# Patient Record
Sex: Female | Born: 1995 | Race: White | Hispanic: No | Marital: Single | Smoking: Former smoker
Health system: Southern US, Community
[De-identification: ages and names within clinical notes are randomized; demographics above are authoritative.]

## PROBLEM LIST (undated history)

## (undated) ENCOUNTER — Inpatient Hospital Stay (HOSPITAL_COMMUNITY): Payer: Self-pay

## (undated) DIAGNOSIS — Z789 Other specified health status: Secondary | ICD-10-CM

## (undated) DIAGNOSIS — R110 Nausea: Secondary | ICD-10-CM

## (undated) DIAGNOSIS — R58 Hemorrhage, not elsewhere classified: Secondary | ICD-10-CM

## (undated) DIAGNOSIS — Z3491 Encounter for supervision of normal pregnancy, unspecified, first trimester: Principal | ICD-10-CM

## (undated) HISTORY — DX: Encounter for supervision of normal pregnancy, unspecified, first trimester: Z34.91

## (undated) HISTORY — DX: Nausea: R11.0

## (undated) HISTORY — PX: NO PAST SURGERIES: SHX2092

## (undated) HISTORY — DX: Hemorrhage, not elsewhere classified: R58

---

## 2013-08-19 NOTE — L&D Delivery Note (Signed)
Delivery Note At 3:05 PM a viable female was delivered via Vaginal, Spontaneous Delivery (Presentation: Left Occiput Anterior).  APGAR: 9, 9; weight pending .   Placenta status: Intact, Spontaneous.  Cord: 3 vessels with the following complications: None.  Cord pH: n/a  Anesthesia: Epidural  Episiotomy: None Lacerations: Bilateral Labial Suture Repair: none, laceration was hemostatic and patient did not desire suture repair Est. Blood Loss (mL): 200  Mom to postpartum.  Baby to Couplet care / Skin to Skin.  Adriana Perry, Caleb 06/22/2014, 3:59 PM   I was gloved and present for delivery in its entirety.  Second stage of labor progressed, baby delivered after 20-30 minutes of pushing.  no decels during second stage noted.  Complications: none  Lacerations: bilateral labial, discussed that they were both well approximated and hemostatic, as such patient declined repair  EBL: 200  Adriana Perry ROCIO, MD 8:09 PM

## 2013-10-25 ENCOUNTER — Encounter: Payer: Self-pay | Admitting: Adult Health

## 2013-10-25 ENCOUNTER — Encounter (INDEPENDENT_AMBULATORY_CARE_PROVIDER_SITE_OTHER): Payer: Self-pay

## 2013-10-25 ENCOUNTER — Ambulatory Visit (INDEPENDENT_AMBULATORY_CARE_PROVIDER_SITE_OTHER): Payer: Medicaid Other | Admitting: Adult Health

## 2013-10-25 VITALS — BP 128/78 | Ht 64.0 in | Wt 161.0 lb

## 2013-10-25 DIAGNOSIS — Z3201 Encounter for pregnancy test, result positive: Secondary | ICD-10-CM

## 2013-10-25 DIAGNOSIS — Z3200 Encounter for pregnancy test, result unknown: Secondary | ICD-10-CM

## 2013-10-25 LAB — POCT URINE PREGNANCY: Preg Test, Ur: POSITIVE

## 2013-10-26 ENCOUNTER — Other Ambulatory Visit: Payer: Self-pay | Admitting: Obstetrics and Gynecology

## 2013-10-26 DIAGNOSIS — O3680X0 Pregnancy with inconclusive fetal viability, not applicable or unspecified: Secondary | ICD-10-CM

## 2013-10-29 ENCOUNTER — Other Ambulatory Visit: Payer: Self-pay | Admitting: Obstetrics and Gynecology

## 2013-10-29 ENCOUNTER — Ambulatory Visit (INDEPENDENT_AMBULATORY_CARE_PROVIDER_SITE_OTHER): Payer: Medicaid Other

## 2013-10-29 DIAGNOSIS — O9989 Other specified diseases and conditions complicating pregnancy, childbirth and the puerperium: Secondary | ICD-10-CM

## 2013-10-29 DIAGNOSIS — O26849 Uterine size-date discrepancy, unspecified trimester: Secondary | ICD-10-CM

## 2013-10-29 DIAGNOSIS — N926 Irregular menstruation, unspecified: Secondary | ICD-10-CM

## 2013-10-29 DIAGNOSIS — O99891 Other specified diseases and conditions complicating pregnancy: Secondary | ICD-10-CM

## 2013-10-29 DIAGNOSIS — O3680X0 Pregnancy with inconclusive fetal viability, not applicable or unspecified: Secondary | ICD-10-CM

## 2013-10-29 NOTE — Progress Notes (Signed)
U/S-transvaginal u/s performed, single IUP with +YS noted no fetal pole noted on today's exam, GS meas c/w 5-6 wks, cx appears closed, bilateral adnexa appears wnl with C.L. Noted on Rt, no free fluid or adnexal masses noted within pelvis

## 2013-11-05 ENCOUNTER — Other Ambulatory Visit: Payer: Self-pay | Admitting: Obstetrics & Gynecology

## 2013-11-05 DIAGNOSIS — O3680X0 Pregnancy with inconclusive fetal viability, not applicable or unspecified: Secondary | ICD-10-CM

## 2013-11-10 ENCOUNTER — Ambulatory Visit (INDEPENDENT_AMBULATORY_CARE_PROVIDER_SITE_OTHER): Payer: Medicaid Other

## 2013-11-10 ENCOUNTER — Other Ambulatory Visit: Payer: Self-pay | Admitting: Obstetrics & Gynecology

## 2013-11-10 ENCOUNTER — Encounter: Payer: Self-pay | Admitting: Women's Health

## 2013-11-10 ENCOUNTER — Ambulatory Visit (INDEPENDENT_AMBULATORY_CARE_PROVIDER_SITE_OTHER): Payer: Medicaid Other | Admitting: Women's Health

## 2013-11-10 VITALS — BP 104/62 | Wt 159.0 lb

## 2013-11-10 DIAGNOSIS — O210 Mild hyperemesis gravidarum: Secondary | ICD-10-CM

## 2013-11-10 DIAGNOSIS — Z3400 Encounter for supervision of normal first pregnancy, unspecified trimester: Secondary | ICD-10-CM | POA: Insufficient documentation

## 2013-11-10 DIAGNOSIS — O3680X0 Pregnancy with inconclusive fetal viability, not applicable or unspecified: Secondary | ICD-10-CM

## 2013-11-10 DIAGNOSIS — O26849 Uterine size-date discrepancy, unspecified trimester: Secondary | ICD-10-CM

## 2013-11-10 DIAGNOSIS — Z331 Pregnant state, incidental: Secondary | ICD-10-CM

## 2013-11-10 DIAGNOSIS — Z1389 Encounter for screening for other disorder: Secondary | ICD-10-CM

## 2013-11-10 LAB — CBC
HEMATOCRIT: 36.5 % (ref 36.0–46.0)
Hemoglobin: 12.8 g/dL (ref 12.0–15.0)
MCH: 31.8 pg (ref 26.0–34.0)
MCHC: 35.1 g/dL (ref 30.0–36.0)
MCV: 90.6 fL (ref 78.0–100.0)
Platelets: 408 10*3/uL — ABNORMAL HIGH (ref 150–400)
RBC: 4.03 MIL/uL (ref 3.87–5.11)
RDW: 13 % (ref 11.5–15.5)
WBC: 15.2 10*3/uL — ABNORMAL HIGH (ref 4.0–10.5)

## 2013-11-10 LAB — POCT URINALYSIS DIPSTICK
Glucose, UA: NEGATIVE
Ketones, UA: NEGATIVE
Nitrite, UA: NEGATIVE

## 2013-11-10 MED ORDER — DOXYLAMINE-PYRIDOXINE 10-10 MG PO TBEC: 10.0000 mg | DELAYED_RELEASE_TABLET | ORAL | Status: DC

## 2013-11-10 NOTE — Progress Notes (Signed)
U/S-single IUP with +FCA noted, FHR-141 BPM, cx long and closed, bilateral adnexa wnl, CRL c/w 6+5wks EDD 07/01/2014

## 2013-11-10 NOTE — Progress Notes (Signed)
  Subjective:  Adriana Perry is a 18 y.o. G1P0 African American female at 26280w5d by today's u/s, being seen today for her first obstetrical visit.  Her obstetrical history is significant for primigravida. previous smoker 6-7cig/wk, stopped w/ +PT. Marland Kitchen.  Pregnancy history fully reviewed.  Patient reports nausea, vomiting and requests meds. Denies vb, cramping, uti s/s, abnormal/malodorous vag d/c, or vulvovaginal itching/irritation.  BP 104/62  Wt 159 lb (72.122 kg)  LMP 09/01/2013  HISTORY: OB History  Gravida Para Term Preterm AB SAB TAB Ectopic Multiple Living  1             # Outcome Date GA Lbr Len/2nd Weight Sex Delivery Anes PTL Lv  1 CUR              History reviewed. No pertinent past medical history. History reviewed. No pertinent past surgical history. Family History  Problem Relation Age of Onset  . Hypertension Paternal Grandfather   . Diabetes Paternal Grandfather   . Hypertension Paternal Grandmother   . Diabetes Paternal Grandmother   . Thyroid disease Paternal Grandmother   . Diabetes Maternal Grandfather   . Hypertension Maternal Grandfather     Exam   System:     General: Well developed & nourished, no acute distress   Skin: Warm & dry, normal coloration and turgor, no rashes   Neurologic: Alert & oriented, normal mood   Cardiovascular: Regular rate & rhythm   Respiratory: Effort & rate normal, LCTAB, acyanotic   Abdomen: Soft, non tender   Extremities: normal strength, tone   Pelvic Exam:    Perineum: Normal perineum   Vulva: Normal, no lesions   Vagina:  Normal mucosa, normal discharge   Cervix: Normal, bulbous, appears closed   Uterus: Normal size/shape/contour for GA   Thin prep pap smear n/a <21yo  FHR: 141 via u/s   Assessment:   Pregnancy: G1P0 Patient Active Problem List   Diagnosis Date Noted  . Supervision of normal first pregnancy 11/10/2013    Priority: High    86280w5d G1P0 New OB visit N/V of pregnancy    Plan:  Initial labs  drawn Continue prenatal vitamins Problem list reviewed and updated Reviewed n/v relief measures and warning s/s to report Rx diclegis, prior auth sent, and 2 samples given.  Reviewed recommended weight gain based on pre-gravid BMI Encouraged well-balanced diet Genetic Screening discussed Integrated Screen: requested Cystic fibrosis screening discussed requested Ultrasound discussed; fetal survey: requested Follow up in 5 weeks for 1st it/nt and visit [facility] completed NFPartnership referral done  Marge DuncansBooker, Trell Secrist Randall CNM, [facility] AssociatesWHNP-BC 11/10/2013 3:35 PM

## 2013-11-10 NOTE — Patient Instructions (Signed)
Nausea & Vomiting  Have saltine crackers or pretzels by your bed and eat a few bites before you raise your head out of bed in the morning  Eat small frequent meals throughout the day instead of large meals  Drink plenty of fluids throughout the day to stay hydrated, just don't drink a lot of fluids with your meals.  This can make your stomach fill up faster making you feel sick  Do not brush your teeth right after you eat  Products with real ginger are good for nausea, like ginger ale and ginger hard candy Make sure it says made with real ginger!  Sucking on sour candy like lemon heads is also good for nausea  If your prenatal vitamins make you nauseated, take them at night so you will sleep through the nausea  If you feel like you need medicine for the nausea & vomiting please let us know  If you are unable to keep any fluids or food down please let us know    Pregnancy - First Trimester During sexual intercourse, millions of sperm go into the vagina. Only 1 sperm will penetrate and fertilize the female egg while it is in the Fallopian tube. One week later, the fertilized egg implants into the wall of the uterus. An embryo begins to develop into a baby. At 6 to 8 weeks, the eyes and face are formed and the heartbeat can be seen on ultrasound. At the end of 12 weeks (first trimester), all the baby's organs are formed. Now that you are pregnant, you will want to do everything you can to have a healthy baby. Two of the most important things are to get good prenatal care and follow your caregiver's instructions. Prenatal care is all the medical care you receive before the baby's birth. It is given to prevent, find, and treat problems during the pregnancy and childbirth. PRENATAL EXAMS  During prenatal visits, your weight, blood pressure, and urine are checked. This is done to make sure you are healthy and progressing normally during the pregnancy.  A pregnant woman should gain 25 to 35 pounds  during the pregnancy. However, if you are overweight or underweight, your caregiver will advise you regarding your weight.  Your caregiver will ask and answer questions for you.  Blood work, cervical cultures, other necessary tests, and a Pap test are done during your prenatal exams. These tests are done to check on your health and the probable health of your baby. Tests are strongly recommended and done for HIV with your permission. This is the virus that causes AIDS. These tests are done because medicines can be given to help prevent your baby from being born with this infection should you have been infected without knowing it. Blood work is also used to find out your blood type, previous infections, and follow your blood levels (hemoglobin).  Low hemoglobin (anemia) is common during pregnancy. Iron and vitamins are given to help prevent this. Later in the pregnancy, blood tests for diabetes will be done along with any other tests if any problems develop.  You may need other tests to make sure you and the baby are doing well. CHANGES DURING THE FIRST TRIMESTER  Your body goes through many changes during pregnancy. They vary from person to person. Talk to your caregiver about changes you notice and are concerned about. Changes can include:  Your menstrual period stops.  The egg and sperm carry the genes that determine what you look like. Genes from you   and your partner are forming a baby. The female genes determine whether the baby is a boy or a girl.  Your body increases in girth and you may feel bloated.  Feeling sick to your stomach (nauseous) and throwing up (vomiting). If the vomiting is uncontrollable, call your caregiver.  Your breasts will begin to enlarge and become tender.  Your nipples may stick out more and become darker.  The need to urinate more. Painful urination may mean you have a bladder infection.  Tiring easily.  Loss of appetite.  Cravings for certain kinds of  food.  At first, you may gain or lose a couple of pounds.  You may have changes in your emotions from day to day (excited to be pregnant or concerned something may go wrong with the pregnancy and baby).  You may have more vivid and strange dreams. HOME CARE INSTRUCTIONS   It is very important to avoid all smoking, alcohol and non-prescribed drugs during your pregnancy. These affect the formation and growth of the baby. Avoid chemicals while pregnant to ensure the delivery of a healthy infant.  Start your prenatal visits by the 12th week of pregnancy. They are usually scheduled monthly at first, then more often in the last 2 months before delivery. Keep your caregiver's appointments. Follow your caregiver's instructions regarding medicine use, blood and lab tests, exercise, and diet.  During pregnancy, you are providing food for you and your baby. Eat regular, well-balanced meals. Choose foods such as meat, fish, milk and other low fat dairy products, vegetables, fruits, and whole-grain breads and cereals. Your caregiver will tell you of the ideal weight gain.  You can help morning sickness by keeping soda crackers at the bedside. Eat a couple before arising in the morning. You may want to use the crackers without salt on them.  Eating 4 to 5 small meals rather than 3 large meals a day also may help the nausea and vomiting.  Drinking liquids between meals instead of during meals also seems to help nausea and vomiting.  A physical sexual relationship may be continued throughout pregnancy if there are no other problems. Problems may be early (premature) leaking of amniotic fluid from the membranes, vaginal bleeding, or belly (abdominal) pain.  Exercise regularly if there are no restrictions. Check with your caregiver or physical therapist if you are unsure of the safety of some of your exercises. Greater weight gain will occur in the last 2 trimesters of pregnancy. Exercising will  help:  Control your weight.  Keep you in shape.  Prepare you for labor and delivery.  Help you lose your pregnancy weight after you deliver your baby.  Wear a good support or jogging bra for breast tenderness during pregnancy. This may help if worn during sleep too.  Ask when prenatal classes are available. Begin classes when they are offered.  Do not use hot tubs, steam rooms, or saunas.  Wear your seat belt when driving. This protects you and your baby if you are in an accident.  Avoid raw meat, uncooked cheese, cat litter boxes, and soil used by cats throughout the pregnancy. These carry germs that can cause birth defects in the baby.  The first trimester is a good time to visit your dentist for your dental health. Getting your teeth cleaned is okay. Use a softer toothbrush and brush gently during pregnancy.  Ask for help if you have financial, counseling, or nutritional needs during pregnancy. Your caregiver will be able to offer counseling for   these needs as well as refer you for other special needs.  Do not take any medicines or herbs unless told by your caregiver.  Inform your caregiver if there is any mental or physical domestic violence.  Make a list of emergency phone numbers of family, friends, hospital, and police and fire departments.  Write down your questions. Take them to your prenatal visit.  Do not douche.  Do not cross your legs.  If you have to stand for long periods of time, rotate you feet or take small steps in a circle.  You may have more vaginal secretions that may require a sanitary pad. Do not use tampons or scented sanitary pads. MEDICINES AND DRUG USE IN PREGNANCY  Take prenatal vitamins as directed. The vitamin should contain 1 milligram of folic acid. Keep all vitamins out of reach of children. Only a couple vitamins or tablets containing iron may be fatal to a baby or young child when ingested.  Avoid use of all medicines, including herbs,  over-the-counter medicines, not prescribed or suggested by your caregiver. Only take over-the-counter or prescription medicines for pain, discomfort, or fever as directed by your caregiver. Do not use aspirin, ibuprofen, or naproxen unless directed by your caregiver.  Let your caregiver also know about herbs you may be using.  Alcohol is related to a number of birth defects. This includes fetal alcohol syndrome. All alcohol, in any form, should be avoided completely. Smoking will cause low birth rate and premature babies.  Street or illegal drugs are very harmful to the baby. They are absolutely forbidden. A baby born to an addicted mother will be addicted at birth. The baby will go through the same withdrawal an adult does.  Let your caregiver know about any medicines that you have to take and for what reason you take them. SEEK MEDICAL CARE IF:  You have any concerns or worries during your pregnancy. It is better to call with your questions if you feel they cannot wait, rather than worry about them. SEEK IMMEDIATE MEDICAL CARE IF:   An unexplained oral temperature above 102 F (38.9 C) develops, or as your caregiver suggests.  You have leaking of fluid from the vagina (birth canal). If leaking membranes are suspected, take your temperature and inform your caregiver of this when you call.  There is vaginal spotting or bleeding. Notify your caregiver of the amount and how many pads are used.  You develop a bad smelling vaginal discharge with a change in the color.  You continue to feel sick to your stomach (nauseated) and have no relief from remedies suggested. You vomit blood or coffee ground-like materials.  You lose more than 2 pounds of weight in 1 week.  You gain more than 2 pounds of weight in 1 week and you notice swelling of your face, hands, feet, or legs.  You gain 5 pounds or more in 1 week (even if you do not have swelling of your hands, face, legs, or feet).  You get  exposed to German measles and have never had them.  You are exposed to fifth disease or chickenpox.  You develop belly (abdominal) pain. Round ligament discomfort is a common non-cancerous (benign) cause of abdominal pain in pregnancy. Your caregiver still must evaluate this.  You develop headache, fever, diarrhea, pain with urination, or shortness of breath.  You fall or are in a car accident or have any kind of trauma.  There is mental or physical violence in your home. Document   Released: 07/30/2001 Document Revised: 04/29/2012 Document Reviewed: 01/31/2009 [employer] Patient Information 2014 [employer].  

## 2013-11-11 LAB — DRUG SCREEN, URINE, NO CONFIRMATION
AMPHETAMINE SCRN UR: NEGATIVE
BENZODIAZEPINES.: NEGATIVE
Barbiturate Quant, Ur: NEGATIVE
Cocaine Metabolites: NEGATIVE
Creatinine,U: 376.6 mg/dL
Marijuana Metabolite: POSITIVE — AB
Methadone: NEGATIVE
Opiate Screen, Urine: NEGATIVE
PHENCYCLIDINE (PCP): NEGATIVE
Propoxyphene: NEGATIVE

## 2013-11-11 LAB — OXYCODONE SCREEN, UA, RFLX CONFIRM: Oxycodone Screen, Ur: NEGATIVE ng/mL

## 2013-11-11 LAB — URINE CULTURE
Colony Count: NO GROWTH
Organism ID, Bacteria: NO GROWTH

## 2013-11-11 LAB — URINALYSIS
Bilirubin Urine: NEGATIVE
Glucose, UA: NEGATIVE mg/dL
Hgb urine dipstick: NEGATIVE
Nitrite: NEGATIVE
Protein, ur: 30 mg/dL — AB
Specific Gravity, Urine: >1.03 — ABNORMAL HIGH (ref 1.005–1.030)
Urobilinogen, UA: 0.2 mg/dL (ref 0.0–1.0)
pH: 6 (ref 5.0–8.0)

## 2013-11-11 LAB — RPR

## 2013-11-11 LAB — GC/CHLAMYDIA PROBE AMP
CT Probe RNA: NEGATIVE
GC Probe RNA: NEGATIVE

## 2013-11-11 LAB — ABO AND RH: Rh Type: POSITIVE

## 2013-11-11 LAB — VARICELLA ZOSTER ANTIBODY, IGG: Varicella IgG: 1145 Index — ABNORMAL HIGH (ref <135.00)

## 2013-11-11 LAB — HIV ANTIBODY (ROUTINE TESTING W REFLEX): HIV: NONREACTIVE

## 2013-11-11 LAB — SICKLE CELL SCREEN: Sickle Cell Screen: NEGATIVE

## 2013-11-11 LAB — RUBELLA SCREEN: Rubella: 6.32 Index — ABNORMAL HIGH (ref <0.90)

## 2013-11-11 LAB — HEPATITIS B SURFACE ANTIGEN: Hepatitis B Surface Ag: NEGATIVE

## 2013-11-11 LAB — ANTIBODY SCREEN: ANTIBODY SCREEN: NEGATIVE

## 2013-11-12 LAB — CYSTIC FIBROSIS DIAGNOSTIC STUDY

## 2013-11-15 ENCOUNTER — Encounter: Payer: Self-pay | Admitting: Women's Health

## 2013-11-15 DIAGNOSIS — F1290 Cannabis use, unspecified, uncomplicated: Secondary | ICD-10-CM | POA: Insufficient documentation

## 2013-12-15 ENCOUNTER — Other Ambulatory Visit: Payer: Self-pay | Admitting: Women's Health

## 2013-12-15 ENCOUNTER — Ambulatory Visit (INDEPENDENT_AMBULATORY_CARE_PROVIDER_SITE_OTHER): Payer: BC Managed Care – PPO

## 2013-12-15 ENCOUNTER — Ambulatory Visit (INDEPENDENT_AMBULATORY_CARE_PROVIDER_SITE_OTHER): Payer: BC Managed Care – PPO | Admitting: Advanced Practice Midwife

## 2013-12-15 ENCOUNTER — Encounter: Payer: Self-pay | Admitting: Advanced Practice Midwife

## 2013-12-15 VITALS — BP 120/70 | Wt 155.0 lb

## 2013-12-15 DIAGNOSIS — Z3400 Encounter for supervision of normal first pregnancy, unspecified trimester: Secondary | ICD-10-CM

## 2013-12-15 DIAGNOSIS — O99320 Drug use complicating pregnancy, unspecified trimester: Secondary | ICD-10-CM

## 2013-12-15 DIAGNOSIS — Z36 Encounter for antenatal screening of mother: Secondary | ICD-10-CM

## 2013-12-15 DIAGNOSIS — Z1389 Encounter for screening for other disorder: Secondary | ICD-10-CM

## 2013-12-15 DIAGNOSIS — F1920 Other psychoactive substance dependence, uncomplicated: Secondary | ICD-10-CM

## 2013-12-15 DIAGNOSIS — Z331 Pregnant state, incidental: Secondary | ICD-10-CM

## 2013-12-15 LAB — POCT URINALYSIS DIPSTICK
Glucose, UA: NEGATIVE
KETONES UA: NEGATIVE
Nitrite, UA: NEGATIVE
PROTEIN UA: NEGATIVE

## 2013-12-15 NOTE — Progress Notes (Addendum)
Had NT/IT today.    No c/o at this time.  Routine questions about pregnancy answered.  F/U in 4 weeks for 2nd IT/Low-risk ob appt .

## 2013-12-15 NOTE — Progress Notes (Signed)
U/S(11+5wks)-single IUP with +FCA noted, FHR- 163 bpm, cx appears closed (3.2cm), bilateral adnexa appears wnl, NB present, NT-1.4 mm, anterior Gr 0 placenta noted

## 2013-12-18 LAB — MATERNAL SCREEN, INTEGRATED #1

## 2014-01-12 ENCOUNTER — Ambulatory Visit (INDEPENDENT_AMBULATORY_CARE_PROVIDER_SITE_OTHER): Payer: BC Managed Care – PPO | Admitting: Adult Health

## 2014-01-12 ENCOUNTER — Encounter: Payer: Self-pay | Admitting: Adult Health

## 2014-01-12 VITALS — BP 128/80 | Wt 159.0 lb

## 2014-01-12 DIAGNOSIS — Z1389 Encounter for screening for other disorder: Secondary | ICD-10-CM

## 2014-01-12 DIAGNOSIS — Z3400 Encounter for supervision of normal first pregnancy, unspecified trimester: Secondary | ICD-10-CM

## 2014-01-12 DIAGNOSIS — R319 Hematuria, unspecified: Secondary | ICD-10-CM

## 2014-01-12 DIAGNOSIS — Z331 Pregnant state, incidental: Secondary | ICD-10-CM

## 2014-01-12 LAB — POCT URINALYSIS DIPSTICK
Blood, UA: 1
Glucose, UA: NEGATIVE
KETONES UA: NEGATIVE
NITRITE UA: NEGATIVE

## 2014-01-12 NOTE — Progress Notes (Signed)
Adriana Perry has no complaints today.Reviewed past medical,surgical, social and family history. Reviewed medications and allergies.Reviewed weight,BP and urine and urine dipstick was trace protein,+leuks and 1+ will send UAC&S.to get 2nd IT today and return in 4 weeks for [place].FHR 156 by doppler.

## 2014-01-12 NOTE — Patient Instructions (Signed)
Second Trimester of Pregnancy The second trimester is from week 13 through week 28, months 4 through 6. The second trimester is often a time when you feel your best. Your body has also adjusted to being pregnant, and you begin to feel better physically. Usually, morning sickness has lessened or quit completely, you may have more energy, and you may have an increase in appetite. The second trimester is also a time when the fetus is growing rapidly. At the end of the sixth month, the fetus is about 9 inches long and weighs about 1 pounds. You will likely begin to feel the baby move (quickening) between 18 and 20 weeks of the pregnancy. BODY CHANGES Your body goes through many changes during pregnancy. The changes vary from woman to woman.   Your weight will continue to increase. You will notice your lower abdomen bulging out.  You may begin to get stretch marks on your hips, abdomen, and breasts.  You may develop headaches that can be relieved by medicines approved by your caregiver.  You may urinate more often because the fetus is pressing on your bladder.  You may develop or continue to have heartburn as a result of your pregnancy.  You may develop constipation because certain hormones are causing the muscles that push waste through your intestines to slow down.  You may develop hemorrhoids or swollen, bulging veins (varicose veins).  You may have back pain because of the weight gain and pregnancy hormones relaxing your joints between the bones in your pelvis and as a result of a shift in weight and the muscles that support your balance.  Your breasts will continue to grow and be tender.  Your gums may bleed and may be sensitive to brushing and flossing.  Dark spots or blotches (chloasma, mask of pregnancy) may develop on your face. This will likely fade after the baby is born.  A dark line from your belly button to the pubic area (linea nigra) may appear. This will likely fade after the  baby is born. WHAT TO EXPECT AT YOUR PRENATAL VISITS During a routine prenatal visit:  You will be weighed to make sure you and the fetus are growing normally.  Your blood pressure will be taken.  Your abdomen will be measured to track your baby's growth.  The fetal heartbeat will be listened to.  Any test results from the previous visit will be discussed. Your caregiver may ask you:  How you are feeling.  If you are feeling the baby move.  If you have had any abnormal symptoms, such as leaking fluid, bleeding, severe headaches, or abdominal cramping.  If you have any questions. Other tests that may be performed during your second trimester include:  Blood tests that check for:  Low iron levels (anemia).  Gestational diabetes (between 24 and 28 weeks).  Rh antibodies.  Urine tests to check for infections, diabetes, or protein in the urine.  An ultrasound to confirm the proper growth and development of the baby.  An amniocentesis to check for possible genetic problems.  Fetal screens for spina bifida and Down syndrome. HOME CARE INSTRUCTIONS   Avoid all smoking, herbs, alcohol, and unprescribed drugs. These chemicals affect the formation and growth of the baby.  Follow your caregiver's instructions regarding medicine use. There are medicines that are either safe or unsafe to take during pregnancy.  Exercise only as directed by your caregiver. Experiencing uterine cramps is a good sign to stop exercising.  Continue to eat regular,   healthy meals.  Wear a good support bra for breast tenderness.  Do not use hot tubs, steam rooms, or saunas.  Wear your seat belt at all times when driving.  Avoid raw meat, uncooked cheese, cat litter boxes, and soil used by cats. These carry germs that can cause birth defects in the baby.  Take your prenatal vitamins.  Try taking a stool softener (if your caregiver approves) if you develop constipation. Eat more high-fiber foods,  such as fresh vegetables or fruit and whole grains. Drink plenty of fluids to keep your urine clear or pale yellow.  Take warm sitz baths to soothe any pain or discomfort caused by hemorrhoids. Use hemorrhoid cream if your caregiver approves.  If you develop varicose veins, wear support hose. Elevate your feet for 15 minutes, 3 4 times a day. Limit salt in your diet.  Avoid heavy lifting, wear low heel shoes, and practice good posture.  Rest with your legs elevated if you have leg cramps or low back pain.  Visit your dentist if you have not gone yet during your pregnancy. Use a soft toothbrush to brush your teeth and be gentle when you floss.  A sexual relationship may be continued unless your caregiver directs you otherwise.  Continue to go to all your prenatal visits as directed by your caregiver. SEEK MEDICAL CARE IF:   You have dizziness.  You have mild pelvic cramps, pelvic pressure, or nagging pain in the abdominal area.  You have persistent nausea, vomiting, or diarrhea.  You have a bad smelling vaginal discharge.  You have pain with urination. SEEK IMMEDIATE MEDICAL CARE IF:   You have a fever.  You are leaking fluid from your vagina.  You have spotting or bleeding from your vagina.  You have severe abdominal cramping or pain.  You have rapid weight gain or loss.  You have shortness of breath with chest pain.  You notice sudden or extreme swelling of your face, hands, ankles, feet, or legs.  You have not felt your baby move in over an hour.  You have severe headaches that do not go away with medicine.  You have vision changes. Document Released: 07/30/2001 Document Revised: 04/07/2013 Document Reviewed: 10/06/2012 [facility] Patient Information 2014 [place], [place]. Follow up in 4 weeks for [place] and see KIM

## 2014-01-13 LAB — URINE CULTURE: Colony Count: 30000

## 2014-01-13 LAB — URINALYSIS
GLUCOSE, UA: NEGATIVE mg/dL
Hgb urine dipstick: NEGATIVE
Ketones, ur: NEGATIVE mg/dL
Nitrite: NEGATIVE
PROTEIN: 30 mg/dL — AB
Specific Gravity, Urine: 1.027 (ref 1.005–1.030)
Urobilinogen, UA: 0.2 mg/dL (ref 0.0–1.0)
pH: 5.5 (ref 5.0–8.0)

## 2014-01-15 LAB — MATERNAL SCREEN, INTEGRATED #2
AFP MOM MAT SCREEN: 1.59
AFP, SERUM MAT SCREEN: 59.3 ng/mL
Age risk Down Syndrome: 1:1100 {titer}
CROWN RUMP LENGTH MAT SCREEN 2: 58.8 mm
Calculated Gestational Age: 16.3
ESTRIOL MOM MAT SCREEN: 1.37
Estriol, Free: 1.18 ng/mL
HCG, MOM MAT SCREEN: 0.92
HCG, SERUM MAT SCREEN: 35.3 [IU]/mL
Inhibin A Dimeric: 292 pg/mL
Inhibin A MoM: 1.81
MSS Trisomy 18 Risk: <1:5000 {titer}
NT MoM: 1.03
NUMBER OF FETUSES MAT SCREEN 2: 1
Nuchal Translucency: 1.4 mm
PAPP-A MAT SCREEN: 871 ng/mL
PAPP-A MoM: 0.78
Rish for ONTD: 1:2000 {titer}

## 2014-01-17 ENCOUNTER — Encounter: Payer: Self-pay | Admitting: Adult Health

## 2014-02-07 ENCOUNTER — Other Ambulatory Visit: Payer: Self-pay | Admitting: Adult Health

## 2014-02-07 ENCOUNTER — Encounter: Payer: Self-pay | Admitting: Obstetrics & Gynecology

## 2014-02-07 ENCOUNTER — Ambulatory Visit (INDEPENDENT_AMBULATORY_CARE_PROVIDER_SITE_OTHER): Payer: BC Managed Care – PPO

## 2014-02-07 ENCOUNTER — Ambulatory Visit (INDEPENDENT_AMBULATORY_CARE_PROVIDER_SITE_OTHER): Payer: BC Managed Care – PPO | Admitting: Obstetrics & Gynecology

## 2014-02-07 VITALS — BP 110/80 | Wt 161.0 lb

## 2014-02-07 DIAGNOSIS — F1920 Other psychoactive substance dependence, uncomplicated: Secondary | ICD-10-CM

## 2014-02-07 DIAGNOSIS — Z3400 Encounter for supervision of normal first pregnancy, unspecified trimester: Secondary | ICD-10-CM

## 2014-02-07 DIAGNOSIS — Z1389 Encounter for screening for other disorder: Secondary | ICD-10-CM

## 2014-02-07 DIAGNOSIS — O99320 Drug use complicating pregnancy, unspecified trimester: Secondary | ICD-10-CM

## 2014-02-07 DIAGNOSIS — Z331 Pregnant state, incidental: Secondary | ICD-10-CM

## 2014-02-07 DIAGNOSIS — Z3402 Encounter for supervision of normal first pregnancy, second trimester: Secondary | ICD-10-CM

## 2014-02-07 LAB — POCT URINALYSIS DIPSTICK
Glucose, UA: NEGATIVE
KETONES UA: NEGATIVE
NITRITE UA: NEGATIVE
Protein, UA: NEGATIVE
RBC UA: NEGATIVE

## 2014-02-07 NOTE — Progress Notes (Signed)
G1P0 8838w3d Estimated Date of Delivery: 07/01/14   Sonogram is reviewed and read all normal Blood pressure 110/80, weight 161 lb (73.029 kg), last menstrual period 09/01/2013.   BP weight and urine results all reviewed and noted.  Please refer to the obstetrical flow sheet for the fundal height and fetal heart rate documentation:  Patient reports good fetal movement, denies any bleeding and no rupture of membranes symptoms or regular contractions. Patient is without complaints. All questions were answered.  Plan:  Continued routine obstetrical care,   Follow up in 4 weeks for OB appointment,

## 2014-02-07 NOTE — Progress Notes (Signed)
U/S(19+3wks)-active fetus, meas c/w dates, fluid wnl, anterior Gr 0 placenta, cx appears closed (3.0cm), bilateral adnexa appears wnl, FHR_ 149 bpm, no major abnl noted, female fetus

## 2014-02-08 ENCOUNTER — Encounter: Payer: Medicaid Other | Admitting: Women's Health

## 2014-02-08 ENCOUNTER — Other Ambulatory Visit: Payer: Medicaid Other

## 2014-03-07 ENCOUNTER — Encounter: Payer: Self-pay | Admitting: Women's Health

## 2014-03-07 ENCOUNTER — Ambulatory Visit (INDEPENDENT_AMBULATORY_CARE_PROVIDER_SITE_OTHER): Payer: BC Managed Care – PPO | Admitting: Women's Health

## 2014-03-07 VITALS — BP 112/70 | Wt 169.0 lb

## 2014-03-07 DIAGNOSIS — Z3400 Encounter for supervision of normal first pregnancy, unspecified trimester: Secondary | ICD-10-CM

## 2014-03-07 DIAGNOSIS — Z331 Pregnant state, incidental: Secondary | ICD-10-CM

## 2014-03-07 DIAGNOSIS — Z3402 Encounter for supervision of normal first pregnancy, second trimester: Secondary | ICD-10-CM

## 2014-03-07 DIAGNOSIS — Z1389 Encounter for screening for other disorder: Secondary | ICD-10-CM

## 2014-03-07 DIAGNOSIS — F1290 Cannabis use, unspecified, uncomplicated: Secondary | ICD-10-CM

## 2014-03-07 LAB — POCT URINALYSIS DIPSTICK
Blood, UA: NEGATIVE
GLUCOSE UA: NEGATIVE
Ketones, UA: NEGATIVE
Nitrite, UA: NEGATIVE
Protein, UA: NEGATIVE

## 2014-03-07 NOTE — Patient Instructions (Signed)
You will have your sugar test next visit.  Please do not eat or drink anything after midnight the night before you come, not even water.  You will be here for at least two hours.     [place] Pediatricians:  [facility] [phone]            [facility] [phone]                 [facility] [phone] (usually doesn't accept new patients unless you have family there already, you are always welcome to call and ask)             [facility] ([address]) [phone]   [facility] Pediatricians:   [facility]: [phone]  [facility]: [phone]   Circumcision: $507 at hospital, $244 at [facility], has to be paid up front before it is done. If you want the circumcision done at [facility] you can make payments during pregnancy. If you are interested in this, see receptionist at check-out.  If your baby is older than 28 days when you have the circumcision done at [facility], the fee will go up to $325.50.    Second Trimester of Pregnancy The second trimester is from week 13 through week 28, months 4 through 6. The second trimester is often a time when you feel your best. Your body has also adjusted to being pregnant, and you begin to feel better physically. Usually, morning sickness has lessened or quit completely, you may have more energy, and you may have an increase in appetite. The second trimester is also a time when the fetus is growing rapidly. At the end of the sixth month, the fetus is about 9 inches long and weighs about 1 pounds. You will likely begin to feel the baby move (quickening) between 18 and 20 weeks of the pregnancy. BODY CHANGES Your body goes through many changes during pregnancy. The changes vary from woman to woman.   Your weight will continue to increase. You will notice your lower abdomen bulging out.  You may begin to get stretch marks on your  hips, abdomen, and breasts.  You may develop headaches that can be relieved by medicines approved by your health care provider.  You may urinate more often because the fetus is pressing on your bladder.  You may develop or continue to have heartburn as a result of your pregnancy.  You may develop constipation because certain hormones are causing the muscles that push waste through your intestines to slow down.  You may develop hemorrhoids or swollen, bulging veins (varicose veins).  You may have back pain because of the weight gain and pregnancy hormones relaxing your joints between the bones in your pelvis and as a result of a shift in weight and the muscles that support your balance.  Your breasts will continue to grow and be tender.  Your gums may bleed and may be sensitive to brushing and flossing.  Dark spots or blotches (chloasma, mask of pregnancy) may develop on your face. This will likely fade after the baby is born.  A dark line from your belly button to the pubic area (linea nigra) may appear. This will likely fade after the baby is born.  You may have changes in your hair. These can include thickening of your hair, rapid growth, and changes in texture. Some women also have hair loss during or after pregnancy, or hair that feels dry  or thin. Your hair will most likely return to normal after your baby is born. WHAT TO EXPECT AT YOUR PRENATAL VISITS During a routine prenatal visit:  You will be weighed to make sure you and the fetus are growing normally.  Your blood pressure will be taken.  Your abdomen will be measured to track your baby's growth.  The fetal heartbeat will be listened to.  Any test results from the previous visit will be discussed. Your health care provider may ask you:  How you are feeling.  If you are feeling the baby move.  If you have had any abnormal symptoms, such as leaking fluid, bleeding, severe headaches, or abdominal cramping.  If you  have any questions. Other tests that may be performed during your second trimester include:  Blood tests that check for:  Low iron levels (anemia).  Gestational diabetes (between 24 and 28 weeks).  Rh antibodies.  Urine tests to check for infections, diabetes, or protein in the urine.  An ultrasound to confirm the proper growth and development of the baby.  An amniocentesis to check for possible genetic problems.  Fetal screens for spina bifida and Down syndrome. HOME CARE INSTRUCTIONS   Avoid all smoking, herbs, alcohol, and unprescribed drugs. These chemicals affect the formation and growth of the baby.  Follow your health care provider's instructions regarding medicine use. There are medicines that are either safe or unsafe to take during pregnancy.  Exercise only as directed by your health care provider. Experiencing uterine cramps is a good sign to stop exercising.  Continue to eat regular, healthy meals.  Wear a good support bra for breast tenderness.  Do not use hot tubs, steam rooms, or saunas.  Wear your seat belt at all times when driving.  Avoid raw meat, uncooked cheese, cat litter boxes, and soil used by cats. These carry germs that can cause birth defects in the baby.  Take your prenatal vitamins.  Try taking a stool softener (if your health care provider approves) if you develop constipation. Eat more high-fiber foods, such as fresh vegetables or fruit and whole grains. Drink plenty of fluids to keep your urine clear or pale yellow.  Take warm sitz baths to soothe any pain or discomfort caused by hemorrhoids. Use hemorrhoid cream if your health care provider approves.  If you develop varicose veins, wear support hose. Elevate your feet for 15 minutes, 3-4 times a day. Limit salt in your diet.  Avoid heavy lifting, wear low heel shoes, and practice good posture.  Rest with your legs elevated if you have leg cramps or low back pain.  Visit your dentist if  you have not gone yet during your pregnancy. Use a soft toothbrush to brush your teeth and be gentle when you floss.  A sexual relationship may be continued unless your health care provider directs you otherwise.  Continue to go to all your prenatal visits as directed by your health care provider. SEEK MEDICAL CARE IF:   You have dizziness.  You have mild pelvic cramps, pelvic pressure, or nagging pain in the abdominal area.  You have persistent nausea, vomiting, or diarrhea.  You have a bad smelling vaginal discharge.  You have pain with urination. SEEK IMMEDIATE MEDICAL CARE IF:   You have a fever.  You are leaking fluid from your vagina.  You have spotting or bleeding from your vagina.  You have severe abdominal cramping or pain.  You have rapid weight gain or loss.  You have  shortness of breath with chest pain.  You notice sudden or extreme swelling of your face, hands, ankles, feet, or legs.  You have not felt your baby move in over an hour.  You have severe headaches that do not go away with medicine.  You have vision changes. Document Released: 07/30/2001 Document Revised: 08/10/2013 Document Reviewed: 10/06/2012 [facility] Patient Information 2015 [place], [place]. This information is not intended to replace advice given to you by your health care provider. Make sure you discuss any questions you have with your health care provider.

## 2014-03-07 NOTE — Progress Notes (Addendum)
Low-risk OB appointment G1P0 5243w3d Estimated Date of Delivery: 07/01/14 BP 112/70  Wt 169 lb (76.658 kg)  LMP 09/01/2013  BP, weight, and urine reviewed.  Refer to obstetrical flow sheet for FH & FHR.  Reports good fm.  Denies regular uc's, lof, vb, or uti s/s. No complaints. Counseled on THC use, last used 5d ago, discussed risks, advised cessation.  Reviewed ptl s/s, fm.  Recommended signing up for cb classes asap!  Plan:  Continue routine obstetrical care  F/U in 4wks for OB appointment and pn2

## 2014-04-05 ENCOUNTER — Encounter: Payer: Self-pay | Admitting: Obstetrics & Gynecology

## 2014-04-05 ENCOUNTER — Other Ambulatory Visit: Payer: BC Managed Care – PPO

## 2014-04-05 ENCOUNTER — Ambulatory Visit (INDEPENDENT_AMBULATORY_CARE_PROVIDER_SITE_OTHER): Payer: BC Managed Care – PPO | Admitting: Obstetrics & Gynecology

## 2014-04-05 VITALS — BP 120/80 | Wt 169.0 lb

## 2014-04-05 DIAGNOSIS — Z3402 Encounter for supervision of normal first pregnancy, second trimester: Secondary | ICD-10-CM

## 2014-04-05 DIAGNOSIS — Z331 Pregnant state, incidental: Secondary | ICD-10-CM

## 2014-04-05 DIAGNOSIS — Z3400 Encounter for supervision of normal first pregnancy, unspecified trimester: Secondary | ICD-10-CM

## 2014-04-05 DIAGNOSIS — Z3403 Encounter for supervision of normal first pregnancy, third trimester: Secondary | ICD-10-CM

## 2014-04-05 DIAGNOSIS — Z1389 Encounter for screening for other disorder: Secondary | ICD-10-CM

## 2014-04-05 DIAGNOSIS — Z131 Encounter for screening for diabetes mellitus: Secondary | ICD-10-CM

## 2014-04-05 DIAGNOSIS — Z1159 Encounter for screening for other viral diseases: Secondary | ICD-10-CM

## 2014-04-05 DIAGNOSIS — Z0184 Encounter for antibody response examination: Secondary | ICD-10-CM

## 2014-04-05 LAB — CBC
HCT: 33.6 % — ABNORMAL LOW (ref 36.0–46.0)
Hemoglobin: 11.7 g/dL — ABNORMAL LOW (ref 12.0–15.0)
MCH: 31.8 pg (ref 26.0–34.0)
MCHC: 34.8 g/dL (ref 30.0–36.0)
MCV: 91.3 fL (ref 78.0–100.0)
Platelets: 346 10*3/uL (ref 150–400)
RBC: 3.68 MIL/uL — AB (ref 3.87–5.11)
RDW: 13.7 % (ref 11.5–15.5)
WBC: 10.8 10*3/uL — ABNORMAL HIGH (ref 4.0–10.5)

## 2014-04-05 LAB — POCT URINALYSIS DIPSTICK
Blood, UA: NEGATIVE
Glucose, UA: NEGATIVE
Ketones, UA: NEGATIVE
NITRITE UA: NEGATIVE

## 2014-04-05 NOTE — Progress Notes (Signed)
G1P0 6555w4d Estimated Date of Delivery: 07/01/14  Blood pressure 120/80, weight 169 lb (76.658 kg), last menstrual period 09/01/2013.   BP weight and urine results all reviewed and noted.  Please refer to the obstetrical flow sheet for the fundal height and fetal heart rate documentation:  Patient reports good fetal movement, denies any bleeding and no rupture of membranes symptoms or regular contractions. Patient is without complaints. All questions were answered.  Plan:  Continued routine obstetrical care,   Follow up in 3 weeks for OB appointment, routine

## 2014-04-06 ENCOUNTER — Encounter: Payer: Self-pay | Admitting: Obstetrics & Gynecology

## 2014-04-06 LAB — GLUCOSE TOLERANCE, 2 HOURS W/ 1HR
GLUCOSE, 2 HOUR: 50 mg/dL — AB (ref 70–139)
GLUCOSE: 75 mg/dL (ref 70–170)
Glucose, Fasting: 69 mg/dL — ABNORMAL LOW (ref 70–99)

## 2014-04-06 LAB — HIV ANTIBODY (ROUTINE TESTING W REFLEX): HIV: NONREACTIVE

## 2014-04-06 LAB — RPR

## 2014-04-06 LAB — HSV 2 ANTIBODY, IGG: HSV 2 Glycoprotein G Ab, IgG: <0.1 IV

## 2014-04-06 LAB — ANTIBODY SCREEN: Antibody Screen: NEGATIVE

## 2014-04-26 ENCOUNTER — Ambulatory Visit (INDEPENDENT_AMBULATORY_CARE_PROVIDER_SITE_OTHER): Payer: Medicaid Other | Admitting: Women's Health

## 2014-04-26 ENCOUNTER — Encounter: Payer: Self-pay | Admitting: Women's Health

## 2014-04-26 VITALS — BP 110/76 | Wt 174.0 lb

## 2014-04-26 DIAGNOSIS — Z331 Pregnant state, incidental: Secondary | ICD-10-CM

## 2014-04-26 DIAGNOSIS — Z1389 Encounter for screening for other disorder: Secondary | ICD-10-CM

## 2014-04-26 DIAGNOSIS — Z3403 Encounter for supervision of normal first pregnancy, third trimester: Secondary | ICD-10-CM

## 2014-04-26 DIAGNOSIS — F1290 Cannabis use, unspecified, uncomplicated: Secondary | ICD-10-CM

## 2014-04-26 DIAGNOSIS — Z3400 Encounter for supervision of normal first pregnancy, unspecified trimester: Secondary | ICD-10-CM

## 2014-04-26 LAB — POCT URINALYSIS DIPSTICK
Blood, UA: NEGATIVE
Glucose, UA: NEGATIVE
Ketones, UA: NEGATIVE
Nitrite, UA: NEGATIVE
PROTEIN UA: NEGATIVE

## 2014-04-26 NOTE — Progress Notes (Signed)
Low-risk OB appointment G1P0 [redacted]w[redacted]d Estimated Date of Delivery: 07/01/14 BP 110/76  Wt 174 lb (78.926 kg)  LMP 09/01/2013  BP, weight, and urine reviewed.  Refer to obstetrical flow sheet for FH & FHR.  Reports good fm.  Denies regular uc's, lof, vb, or uti s/s. Some LBP- reviewed prevention/relief measures.  No THC since 7/28, will repeat uds today.  Reviewed ptl s/s, fkc.  Plan:  Continue routine obstetrical care  F/U in 2wks for OB appointment

## 2014-04-26 NOTE — Patient Instructions (Addendum)
Circumcision: $507 at hospital, $244 at [facility], has to be paid up front before it is done. If you want the circumcision done at [facility] you can make payments during pregnancy. If you are interested in this, see receptionist at check-out.  If your baby is older than 28 days when you have the circumcision done at [facility], the fee will go up to $325.50.    Call the office [phone]) or go to [facility] if:  You begin to have strong, frequent contractions  Your water breaks.  Sometimes it is a big gush of fluid, sometimes it is just a trickle that keeps getting your panties wet or running down your legs  You have vaginal bleeding.  It is normal to have a small amount of spotting if your cervix was checked.   You don't feel your baby moving like normal.  If you don't, get you something to eat and drink and lay down and focus on feeling your baby move.  You should feel at least 10 movements in 2 hours.  If you don't, you should call the office or go to [facility].    Third Trimester of Pregnancy The third trimester is from week 29 through week 42, months 7 through 9. The third trimester is a time when the fetus is growing rapidly. At the end of the ninth month, the fetus is about 20 inches in length and weighs 6-10 pounds.  BODY CHANGES Your body goes through many changes during pregnancy. The changes vary from woman to woman.   Your weight will continue to increase. You can expect to gain 25-35 pounds (11-16 kg) by the end of the pregnancy.  You may begin to get stretch marks on your hips, abdomen, and breasts.  You may urinate more often because the fetus is moving lower into your pelvis and pressing on your bladder.  You may develop or continue to have heartburn as a result of your pregnancy.  You may develop constipation because certain hormones are causing the muscles that push waste through your intestines to slow down.  You may develop hemorrhoids or swollen,  bulging veins (varicose veins).  You may have pelvic pain because of the weight gain and pregnancy hormones relaxing your joints between the bones in your pelvis. Backaches may result from overexertion of the muscles supporting your posture.  You may have changes in your hair. These can include thickening of your hair, rapid growth, and changes in texture. Some women also have hair loss during or after pregnancy, or hair that feels dry or thin. Your hair will most likely return to normal after your baby is born.  Your breasts will continue to grow and be tender. A yellow discharge may leak from your breasts called colostrum.  Your belly button may stick out.  You may feel short of breath because of your expanding uterus.  You may notice the fetus "dropping," or moving lower in your abdomen.  You may have a bloody mucus discharge. This usually occurs a few days to a week before labor begins.  Your cervix becomes thin and soft (effaced) near your due date. WHAT TO EXPECT AT YOUR PRENATAL EXAMS  You will have prenatal exams every 2 weeks until week 36. Then, you will have weekly prenatal exams. During a routine prenatal visit:  You will be weighed to make sure you and the fetus are growing normally.  Your blood pressure is taken.  Your abdomen will be measured to track  your baby's growth.  The fetal heartbeat will be listened to.  Any test results from the previous visit will be discussed.  You may have a cervical check near your due date to see if you have effaced. At around 36 weeks, your caregiver will check your cervix. At the same time, your caregiver will also perform a test on the secretions of the vaginal tissue. This test is to determine if a type of bacteria, Group B streptococcus, is present. Your caregiver will explain this further. Your caregiver may ask you:  What your birth plan is.  How you are feeling.  If you are feeling the baby move.  If you have had any  abnormal symptoms, such as leaking fluid, bleeding, severe headaches, or abdominal cramping.  If you have any questions. Other tests or screenings that may be performed during your third trimester include:  Blood tests that check for low iron levels (anemia).  Fetal testing to check the health, activity level, and growth of the fetus. Testing is done if you have certain medical conditions or if there are problems during the pregnancy. FALSE LABOR You may feel small, irregular contractions that eventually go away. These are called Braxton Hicks contractions, or false labor. Contractions may last for hours, days, or even weeks before true labor sets in. If contractions come at regular intervals, intensify, or become painful, it is best to be seen by your caregiver.  SIGNS OF LABOR   Menstrual-like cramps.  Contractions that are 5 minutes apart or less.  Contractions that start on the top of the uterus and spread down to the lower abdomen and back.  A sense of increased pelvic pressure or back pain.  A watery or bloody mucus discharge that comes from the vagina. If you have any of these signs before the 37th week of pregnancy, call your caregiver right away. You need to go to the hospital to get checked immediately. HOME CARE INSTRUCTIONS   Avoid all smoking, herbs, alcohol, and unprescribed drugs. These chemicals affect the formation and growth of the baby.  Follow your caregiver's instructions regarding medicine use. There are medicines that are either safe or unsafe to take during pregnancy.  Exercise only as directed by your caregiver. Experiencing uterine cramps is a good sign to stop exercising.  Continue to eat regular, healthy meals.  Wear a good support bra for breast tenderness.  Do not use hot tubs, steam rooms, or saunas.  Wear your seat belt at all times when driving.  Avoid raw meat, uncooked cheese, cat litter boxes, and soil used by cats. These carry germs that can  cause birth defects in the baby.  Take your prenatal vitamins.  Try taking a stool softener (if your caregiver approves) if you develop constipation. Eat more high-fiber foods, such as fresh vegetables or fruit and whole grains. Drink plenty of fluids to keep your urine clear or pale yellow.  Take warm sitz baths to soothe any pain or discomfort caused by hemorrhoids. Use hemorrhoid cream if your caregiver approves.  If you develop varicose veins, wear support hose. Elevate your feet for 15 minutes, 3-4 times a day. Limit salt in your diet.  Avoid heavy lifting, wear low heal shoes, and practice good posture.  Rest a lot with your legs elevated if you have leg cramps or low back pain.  Visit your dentist if you have not gone during your pregnancy. Use a soft toothbrush to brush your teeth and be gentle when you floss.  A sexual relationship may be continued unless your caregiver directs you otherwise.  Do not travel far distances unless it is absolutely necessary and only with the approval of your caregiver.  Take prenatal classes to understand, practice, and ask questions about the labor and delivery.  Make a trial run to the hospital.  Pack your hospital bag.  Prepare the baby's nursery.  Continue to go to all your prenatal visits as directed by your caregiver. SEEK MEDICAL CARE IF:  You are unsure if you are in labor or if your water has broken.  You have dizziness.  You have mild pelvic cramps, pelvic pressure, or nagging pain in your abdominal area.  You have persistent nausea, vomiting, or diarrhea.  You have a bad smelling vaginal discharge.  You have pain with urination. SEEK IMMEDIATE MEDICAL CARE IF:   You have a fever.  You are leaking fluid from your vagina.  You have spotting or bleeding from your vagina.  You have severe abdominal cramping or pain.  You have rapid weight loss or gain.  You have shortness of breath with chest pain.  You notice  sudden or extreme swelling of your face, hands, ankles, feet, or legs.  You have not felt your baby move in over an hour.  You have severe headaches that do not go away with medicine.  You have vision changes. Document Released: 07/30/2001 Document Revised: 08/10/2013 Document Reviewed: 10/06/2012 [facility] Patient Information 2015 [place], [place]. This information is not intended to replace advice given to you by your health care provider. Make sure you discuss any questions you have with your health care provider.

## 2014-04-27 ENCOUNTER — Encounter: Payer: Self-pay | Admitting: Women's Health

## 2014-04-27 LAB — DRUG SCREEN, URINE, NO CONFIRMATION
AMPHETAMINE SCRN UR: NEGATIVE
BARBITURATE QUANT UR: NEGATIVE
Benzodiazepines.: NEGATIVE
Cocaine Metabolites: NEGATIVE
Creatinine,U: 180.5 mg/dL
METHADONE: NEGATIVE
Marijuana Metabolite: NEGATIVE
Opiate Screen, Urine: NEGATIVE
Phencyclidine (PCP): NEGATIVE
Propoxyphene: NEGATIVE

## 2014-05-06 ENCOUNTER — Encounter (HOSPITAL_COMMUNITY): Payer: Self-pay | Admitting: *Deleted

## 2014-05-06 ENCOUNTER — Inpatient Hospital Stay (HOSPITAL_COMMUNITY)
Admission: AD | Admit: 2014-05-06 | Discharge: 2014-05-06 | Disposition: A | Discharge status: Home / Self Care | Payer: Medicaid Other | Source: Ambulatory Visit | Attending: Obstetrics and Gynecology | Admitting: Obstetrics and Gynecology

## 2014-05-06 DIAGNOSIS — O98819 Other maternal infectious and parasitic diseases complicating pregnancy, unspecified trimester: Secondary | ICD-10-CM | POA: Diagnosis not present

## 2014-05-06 DIAGNOSIS — O2390 Unspecified genitourinary tract infection in pregnancy, unspecified trimester: Secondary | ICD-10-CM

## 2014-05-06 DIAGNOSIS — O23593 Infection of other part of genital tract in pregnancy, third trimester: Secondary | ICD-10-CM

## 2014-05-06 DIAGNOSIS — R109 Unspecified abdominal pain: Secondary | ICD-10-CM | POA: Diagnosis present

## 2014-05-06 DIAGNOSIS — A5901 Trichomonal vulvovaginitis: Secondary | ICD-10-CM | POA: Diagnosis not present

## 2014-05-06 DIAGNOSIS — Z87891 Personal history of nicotine dependence: Secondary | ICD-10-CM | POA: Insufficient documentation

## 2014-05-06 LAB — URINALYSIS, ROUTINE W REFLEX MICROSCOPIC
Bilirubin Urine: NEGATIVE
GLUCOSE, UA: NEGATIVE mg/dL
KETONES UR: NEGATIVE mg/dL
NITRITE: NEGATIVE
Protein, ur: NEGATIVE mg/dL
Specific Gravity, Urine: 1.01 (ref 1.005–1.030)
Urobilinogen, UA: 0.2 mg/dL (ref 0.0–1.0)
pH: 7 (ref 5.0–8.0)

## 2014-05-06 LAB — URINE MICROSCOPIC-ADD ON

## 2014-05-06 MED ORDER — METRONIDAZOLE 500 MG PO TABS
2000.0000 mg | ORAL_TABLET | Freq: Once | ORAL | Status: DC
Start: 2014-05-06 — End: 2014-06-13

## 2014-05-06 NOTE — MAU Note (Signed)
Patient state she has been having lower abdominal pain since last night. Denies bleeding or leaking and reports good fetal movement.

## 2014-05-06 NOTE — Discharge Instructions (Signed)

## 2014-05-06 NOTE — MAU Provider Note (Signed)
  History     CSN: [id number]  Arrival date and time: 05/06/14 1739   None     Chief Complaint  Patient presents with  . Abdominal Pain   HPI Ms Bures is a 18yo G1 @ 32.0wks by 6wk U/S who presents for eval of sharp low abd discomfort that has actually resolved at this time. Denies leaking or bldg. Reports +FM. Denies any unusual vag d/c. Her preg has been followed by the [facility] service and has been remarkable for +THC in preg, but neg at most recent test.  OB History   Grav Para Term Preterm Abortions TAB SAB Ect Mult Living   1               No past medical history on file.  No past surgical history on file.  Family History  Problem Relation Age of Onset  . Hypertension Paternal Grandfather   . Diabetes Paternal Grandfather   . Hypertension Paternal Grandmother   . Diabetes Paternal Grandmother   . Thyroid disease Paternal Grandmother   . Diabetes Maternal Grandfather   . Hypertension Maternal Grandfather     History  Substance Use Topics  . Smoking status: Former Games developer  . Smokeless tobacco: Never Used  . Alcohol Use: No    Allergies: No Known Allergies  Prescriptions prior to admission  Medication Sig Dispense Refill  . Pediatric Multivit-Minerals-C (FLINTSTONES COMPLETE PO) Take 2 tablets by mouth daily. Takes 2 daily        ROS Physical Exam   Blood pressure 120/69, pulse 93, temperature 98.5 F (36.9 C), temperature source Oral, resp. rate 16, height  (1.626 m), weight 81.375 kg (179 lb 6.4 oz), last menstrual period 09/01/2013, SpO2 100.00%.  Physical Exam  Constitutional: She is oriented to person, place, and time. She appears well-developed.  HENT:  Head: Normocephalic.  Neck: Normal range of motion.  Cardiovascular: Normal rate.   Respiratory: Effort normal.  GI:  EFM 130-140s, +accels, no decels No ctx per toco  Genitourinary: Vagina normal.  Cx post/closed/50%  Musculoskeletal: Normal range of motion.  Neurological: She is  alert and oriented to person, place, and time.  Skin: Skin is warm and dry.  Psychiatric: She has a normal mood and affect. Her behavior is normal. Thought content normal.   Urinalysis    Component Value Date/Time   COLORURINE YELLOW 05/06/2014 1810   APPEARANCEUR CLEAR 05/06/2014 1810   LABSPEC 1.010 05/06/2014 1810   PHURINE 7.0 05/06/2014 1810   GLUCOSEU NEGATIVE 05/06/2014 1810   HGBUR TRACE* 05/06/2014 1810   BILIRUBINUR NEGATIVE 05/06/2014 1810   KETONESUR NEGATIVE 05/06/2014 1810   PROTEINUR NEGATIVE 05/06/2014 1810   PROTEINUR neg 04/26/2014 0919   UROBILINOGEN 0.2 05/06/2014 1810   NITRITE NEGATIVE 05/06/2014 1810   NITRITE neg 04/26/2014 0919   LEUKOCYTESUR MODERATE* 05/06/2014 1810   Micro: few squam, few bacteria, +trichomonas  MAU Course  Procedures    Assessment and Plan  IUP @ 32.0wks Trichomoniasis  D/C home with rx Flagyl 2gm (pt elected rx rather than wait in MAU x 1hr for allergic rxn) Rev'd no intercourse until BOTH partners tx x 1wk GC/chlam pending; HIV ordered, but not drawn prior to pt d/c  Cam Hai CNM 05/06/2014, 8:44 PM

## 2014-05-07 LAB — GC/CHLAMYDIA PROBE AMP
CT PROBE, AMP APTIMA: NEGATIVE
GC PROBE AMP APTIMA: NEGATIVE

## 2014-05-08 NOTE — MAU Provider Note (Signed)
Attestation of Attending Supervision of Advanced Practitioner (CNM/NP): Evaluation and management procedures were performed by the Advanced Practitioner under my supervision and collaboration.  I have reviewed the Advanced Practitioner's note and chart, and I agree with the management and plan.  Deen Deguia 05/08/2014 9:55 AM

## 2014-05-10 ENCOUNTER — Ambulatory Visit (INDEPENDENT_AMBULATORY_CARE_PROVIDER_SITE_OTHER): Payer: Medicaid Other | Admitting: Advanced Practice Midwife

## 2014-05-10 VITALS — BP 128/80 | Wt 181.0 lb

## 2014-05-10 DIAGNOSIS — Z331 Pregnant state, incidental: Secondary | ICD-10-CM

## 2014-05-10 DIAGNOSIS — A5901 Trichomonal vulvovaginitis: Secondary | ICD-10-CM | POA: Insufficient documentation

## 2014-05-10 DIAGNOSIS — Z1389 Encounter for screening for other disorder: Secondary | ICD-10-CM

## 2014-05-10 DIAGNOSIS — Z3403 Encounter for supervision of normal first pregnancy, third trimester: Secondary | ICD-10-CM

## 2014-05-10 DIAGNOSIS — O23599 Infection of other part of genital tract in pregnancy, unspecified trimester: Secondary | ICD-10-CM

## 2014-05-10 DIAGNOSIS — O23593 Infection of other part of genital tract in pregnancy, third trimester: Secondary | ICD-10-CM

## 2014-05-10 DIAGNOSIS — Z3400 Encounter for supervision of normal first pregnancy, unspecified trimester: Secondary | ICD-10-CM

## 2014-05-10 LAB — POCT URINALYSIS DIPSTICK
Glucose, UA: NEGATIVE
Ketones, UA: NEGATIVE
NITRITE UA: NEGATIVE
Protein, UA: NEGATIVE
RBC UA: NEGATIVE

## 2014-05-10 NOTE — Progress Notes (Signed)
G1P0 [redacted]w[redacted]d Estimated Date of Delivery: 07/01/14  Blood pressure 128/80, weight 181 lb (82.101 kg), last menstrual period 09/01/2013.   BP weight and urine results all reviewed and noted.  Please refer to the obstetrical flow sheet for the fundal height and fetal heart rate documentation:  Patient reports good fetal movement, denies any bleeding and no rupture of membranes symptoms or regular contractions. Patient is without complaints.  She went to MAU 9/18 and was dx with trichomonas.  She was treated and partner plans to go to [place] this week.  Advised not to have unprotected sex until at least 2 weeks after he has been treated.  All questions were answered.  Plan:  Continued routine obstetrical care,   Follow up in 2weeks for OB appointment,

## 2014-05-24 ENCOUNTER — Encounter: Payer: Self-pay | Admitting: Women's Health

## 2014-05-24 ENCOUNTER — Ambulatory Visit (INDEPENDENT_AMBULATORY_CARE_PROVIDER_SITE_OTHER): Payer: Medicaid Other | Admitting: Women's Health

## 2014-05-24 VITALS — BP 116/70 | Wt 186.0 lb

## 2014-05-24 DIAGNOSIS — Z23 Encounter for immunization: Secondary | ICD-10-CM

## 2014-05-24 DIAGNOSIS — Z331 Pregnant state, incidental: Secondary | ICD-10-CM

## 2014-05-24 DIAGNOSIS — Z3403 Encounter for supervision of normal first pregnancy, third trimester: Secondary | ICD-10-CM

## 2014-05-24 DIAGNOSIS — Z1389 Encounter for screening for other disorder: Secondary | ICD-10-CM

## 2014-05-24 LAB — POCT URINALYSIS DIPSTICK
Glucose, UA: NEGATIVE
Ketones, UA: NEGATIVE
Leukocytes, UA: NEGATIVE
NITRITE UA: NEGATIVE
PROTEIN UA: NEGATIVE

## 2014-05-24 NOTE — Patient Instructions (Signed)
Call the office ([phone]) or go to [facility] if:  You begin to have strong, frequent contractions  Your water breaks.  Sometimes it is a big gush of fluid, sometimes it is just a trickle that keeps getting your panties wet or running down your legs  You have vaginal bleeding.  It is normal to have a small amount of spotting if your cervix was checked.   You don't feel your baby moving like normal.  If you don't, get you something to eat and drink and lay down and focus on feeling your baby move.  You should feel at least 10 movements in 2 hours.  If you don't, you should call the office or go to [facility].    Tdap Vaccine  It is recommended that you get the Tdap vaccine during the third trimester of EACH pregnancy to help protect your baby from getting pertussis (whooping cough)  27-36 weeks is the BEST time to do this so that you can pass the protection on to your baby. During pregnancy is better than after pregnancy, but if you are unable to get it during pregnancy it will be offered at the hospital.   You can get this vaccine at the health department or your family doctor  Everyone who will be around your baby should also be up-to-date on their vaccines. Adults (who are not pregnant) only need 1 dose of Tdap during adulthood.    Preterm Labor Information Preterm labor is when labor starts at less than 37 weeks of pregnancy. The normal length of a pregnancy is 39 to 41 weeks. CAUSES Often, there is no identifiable underlying cause as to why a woman goes into preterm labor. One of the most common known causes of preterm labor is infection. Infections of the uterus, cervix, vagina, amniotic sac, bladder, kidney, or even the lungs (pneumonia) can cause labor to start. Other suspected causes of preterm labor include:   Urogenital infections, such as yeast infections and bacterial vaginosis.   Uterine abnormalities (uterine shape, uterine septum, fibroids, or bleeding from the  placenta).   A cervix that has been operated on (it may fail to stay closed).   Malformations in the fetus.   Multiple gestations (twins, triplets, and so on).   Breakage of the amniotic sac.  RISK FACTORS  Having a previous history of preterm labor.   Having premature rupture of membranes (PROM).   Having a placenta that covers the opening of the cervix (placenta previa).   Having a placenta that separates from the uterus (placental abruption).   Having a cervix that is too weak to hold the fetus in the uterus (incompetent cervix).   Having too much fluid in the amniotic sac (polyhydramnios).   Taking illegal drugs or smoking while pregnant.   Not gaining enough weight while pregnant.   Being younger than 18 and older than 20 years old.   Having a low socioeconomic status.   Being African American. SYMPTOMS Signs and symptoms of preterm labor include:   Menstrual-like cramps, abdominal pain, or back pain.  Uterine contractions that are regular, as frequent as six in an hour, regardless of their intensity (may be mild or painful).  Contractions that start on the top of the uterus and spread down to the lower abdomen and back.   A sense of increased pelvic pressure.   A watery or bloody mucus discharge that comes from the vagina.  TREATMENT Depending on the length of the pregnancy and other circumstances,   your health care provider may suggest bed rest. If necessary, there are medicines that can be given to stop contractions and to mature the fetal lungs. If labor happens before 34 weeks of pregnancy, a prolonged hospital stay may be recommended. Treatment depends on the condition of both you and the fetus.  WHAT SHOULD YOU DO IF YOU THINK YOU ARE IN PRETERM LABOR? Call your health care provider right away. You will need to go to the hospital to get checked immediately. HOW CAN YOU PREVENT PRETERM LABOR IN FUTURE PREGNANCIES? You should:   Stop  smoking if you smoke.  Maintain healthy weight gain and avoid chemicals and drugs that are not necessary.  Be watchful for any type of infection.  Inform your health care provider if you have a known history of preterm labor. Document Released: 10/26/2003 Document Revised: 04/07/2013 Document Reviewed: 09/07/2012 ExitCare Patient Information 2015 [employer]. This information is not intended to replace advice given to you by your health care provider. Make sure you discuss any questions you have with your health care provider.  

## 2014-05-24 NOTE — Progress Notes (Signed)
Low-risk OB appointment G1P0 6643w4d Estimated Date of Delivery: 07/01/14 BP 116/70  Wt 186 lb (84.369 kg)  LMP 09/01/2013  BP, weight, and urine reviewed.  Refer to obstetrical flow sheet for FH & FHR.  Reports good fm.  Denies regular uc's, lof, vb, or uti s/s. No complaints.  Reviewed ptl s/s, fkc. Recommended Tdap at HD/PCP per CDC guidelines.  Plan:  Continue routine obstetrical care  F/U in 2wks for OB appointment, gbs, and trich poc Flu shot today

## 2014-06-07 ENCOUNTER — Encounter: Payer: Self-pay | Admitting: *Deleted

## 2014-06-07 ENCOUNTER — Encounter: Payer: Medicaid Other | Admitting: Women's Health

## 2014-06-10 ENCOUNTER — Encounter: Payer: Self-pay | Admitting: Obstetrics & Gynecology

## 2014-06-10 ENCOUNTER — Encounter: Payer: Medicaid Other | Admitting: Obstetrics & Gynecology

## 2014-06-13 ENCOUNTER — Ambulatory Visit (INDEPENDENT_AMBULATORY_CARE_PROVIDER_SITE_OTHER): Payer: Medicaid Other | Admitting: Obstetrics and Gynecology

## 2014-06-13 ENCOUNTER — Encounter: Payer: Self-pay | Admitting: Obstetrics and Gynecology

## 2014-06-13 ENCOUNTER — Telehealth: Payer: Self-pay | Admitting: Women's Health

## 2014-06-13 VITALS — BP 100/70 | Wt 188.0 lb

## 2014-06-13 DIAGNOSIS — Z1389 Encounter for screening for other disorder: Secondary | ICD-10-CM

## 2014-06-13 DIAGNOSIS — Z1159 Encounter for screening for other viral diseases: Secondary | ICD-10-CM

## 2014-06-13 DIAGNOSIS — Z3685 Encounter for antenatal screening for Streptococcus B: Secondary | ICD-10-CM

## 2014-06-13 DIAGNOSIS — Z3483 Encounter for supervision of other normal pregnancy, third trimester: Secondary | ICD-10-CM

## 2014-06-13 DIAGNOSIS — Z118 Encounter for screening for other infectious and parasitic diseases: Secondary | ICD-10-CM

## 2014-06-13 DIAGNOSIS — Z331 Pregnant state, incidental: Secondary | ICD-10-CM

## 2014-06-13 LAB — POCT URINALYSIS DIPSTICK
Blood, UA: 1
Glucose, UA: NEGATIVE
KETONES UA: NEGATIVE
LEUKOCYTES UA: NEGATIVE
Nitrite, UA: NEGATIVE
PROTEIN UA: NEGATIVE

## 2014-06-13 NOTE — Patient Instructions (Signed)
Earlier in pregnancy you were diagnosed with Trichomonas infection, we will need to check next week. [facility]

## 2014-06-13 NOTE — Progress Notes (Signed)
Patient ID: Adriana Perry, female   DOB: [birth year], 18 y.o.   MRN: [id number] G1P0 2467w3d Estimated Date of Delivery: 07/01/14  Blood pressure 100/70, weight 188 lb (85.276 kg), last menstrual period 09/01/2013.   refer to the ob flow sheet for FH and FHR, also BP, Wt, Urine results:negative  Patient reports +good fetal movement, denies any bleeding and no rupture of membranes symptoms or regular contractions. Pt has NOT gone to childbirth classes, not toured hospital. Patient complaints:  Pt has been complaining of constant, brown discharge.  She had bright red vaginal bleeding this morning but states that it has resolved.    FH - 36cm FHR - 145 CERVIX: Vertex/20%/closed/posterior/-2   Assessment: Tinea corporis, 4367w3d, G1P0  GBS/GC/Chl testing Plan:  Continued routine obstetrical care,  F/u in 1 week   This chart was scribed for Adriana BurrowJohn Zhion Pevehouse V, MD by Adriana Perry, ED Scribe. This patient was seen in Room 2 and the patient's care was started at 12:29 PM.

## 2014-06-13 NOTE — Progress Notes (Signed)
Pt states that she had a brownish mucus discharge that started this morning, that went to a bright red spotting but is not bleeding at this time.

## 2014-06-13 NOTE — Telephone Encounter (Signed)
Returned pts call and she was in our office being checked out by Dr. Emelda FearFerguson.

## 2014-06-14 LAB — GC/CHLAMYDIA PROBE AMP
CT Probe RNA: NEGATIVE
GC Probe RNA: NEGATIVE

## 2014-06-14 LAB — STREP B DNA PROBE: GBSP: DETECTED

## 2014-06-18 ENCOUNTER — Encounter: Payer: Self-pay | Admitting: Obstetrics and Gynecology

## 2014-06-20 ENCOUNTER — Encounter: Payer: Self-pay | Admitting: Obstetrics and Gynecology

## 2014-06-21 ENCOUNTER — Encounter: Payer: Medicaid Other | Admitting: Obstetrics & Gynecology

## 2014-06-22 ENCOUNTER — Inpatient Hospital Stay (HOSPITAL_COMMUNITY): Payer: Medicaid Other | Admitting: Anesthesiology

## 2014-06-22 ENCOUNTER — Encounter: Payer: Self-pay | Admitting: Advanced Practice Midwife

## 2014-06-22 ENCOUNTER — Ambulatory Visit (INDEPENDENT_AMBULATORY_CARE_PROVIDER_SITE_OTHER): Payer: Medicaid Other | Admitting: Advanced Practice Midwife

## 2014-06-22 ENCOUNTER — Inpatient Hospital Stay (HOSPITAL_COMMUNITY)
Admission: AD | Admit: 2014-06-22 | Discharge: 2014-06-24 | DRG: 775 | Disposition: A | Discharge status: Home / Self Care | Payer: Medicaid Other | Source: Ambulatory Visit | Attending: Obstetrics & Gynecology | Admitting: Obstetrics & Gynecology

## 2014-06-22 ENCOUNTER — Encounter (HOSPITAL_COMMUNITY): Payer: Self-pay | Admitting: *Deleted

## 2014-06-22 VITALS — BP 150/98 | Wt 193.0 lb

## 2014-06-22 DIAGNOSIS — Z87891 Personal history of nicotine dependence: Secondary | ICD-10-CM

## 2014-06-22 DIAGNOSIS — Z3A38 38 weeks gestation of pregnancy: Secondary | ICD-10-CM | POA: Diagnosis present

## 2014-06-22 DIAGNOSIS — O471 False labor at or after 37 completed weeks of gestation: Secondary | ICD-10-CM | POA: Diagnosis present

## 2014-06-22 DIAGNOSIS — Z3403 Encounter for supervision of normal first pregnancy, third trimester: Secondary | ICD-10-CM

## 2014-06-22 HISTORY — DX: Other specified health status: Z78.9

## 2014-06-22 LAB — CBC
HCT: 33.7 % — ABNORMAL LOW (ref 36.0–46.0)
HEMOGLOBIN: 11.7 g/dL — AB (ref 12.0–15.0)
MCH: 32.5 pg (ref 26.0–34.0)
MCHC: 34.7 g/dL (ref 30.0–36.0)
MCV: 93.6 fL (ref 78.0–100.0)
PLATELETS: 317 10*3/uL (ref 150–400)
RBC: 3.6 MIL/uL — AB (ref 3.87–5.11)
RDW: 13.6 % (ref 11.5–15.5)
WBC: 12.7 10*3/uL — AB (ref 4.0–10.5)

## 2014-06-22 LAB — TYPE AND SCREEN
ABO/RH(D): O POS
ANTIBODY SCREEN: NEGATIVE

## 2014-06-22 LAB — RPR

## 2014-06-22 LAB — HIV ANTIBODY (ROUTINE TESTING W REFLEX): HIV: NONREACTIVE

## 2014-06-22 LAB — ABO/RH: ABO/RH(D): O POS

## 2014-06-22 MED ORDER — FENTANYL CITRATE 0.05 MG/ML IJ SOLN
50.0000 ug | INTRAMUSCULAR | Status: DC | PRN
  Administered 2014-06-22: 50 ug via INTRAVENOUS
  Filled 2014-06-22: qty 2

## 2014-06-22 MED ORDER — LACTATED RINGERS IV SOLN
INTRAVENOUS | Status: DC
  Administered 2014-06-22 (×2): via INTRAVENOUS

## 2014-06-22 MED ORDER — SENNOSIDES-DOCUSATE SODIUM 8.6-50 MG PO TABS
2.0000 | ORAL_TABLET | ORAL | Status: DC
  Administered 2014-06-22 – 2014-06-23 (×2): 2 via ORAL
  Filled 2014-06-22 (×2): qty 2

## 2014-06-22 MED ORDER — PHENYLEPHRINE 40 MCG/ML (10ML) SYRINGE FOR IV PUSH (FOR BLOOD PRESSURE SUPPORT)
80.0000 ug | PREFILLED_SYRINGE | INTRAVENOUS | Status: DC | PRN
  Filled 2014-06-22: qty 2

## 2014-06-22 MED ORDER — ONDANSETRON HCL 4 MG/2ML IJ SOLN: 4.0000 mg | INTRAMUSCULAR | Status: DC | PRN

## 2014-06-22 MED ORDER — EPHEDRINE 5 MG/ML INJ
10.0000 mg | INTRAVENOUS | Status: DC | PRN
  Filled 2014-06-22: qty 2

## 2014-06-22 MED ORDER — ONDANSETRON HCL 4 MG/2ML IJ SOLN: 4.0000 mg | Freq: Four times a day (QID) | INTRAMUSCULAR | Status: DC | PRN

## 2014-06-22 MED ORDER — LIDOCAINE HCL (PF) 1 % IJ SOLN
30.0000 mL | INTRAMUSCULAR | Status: DC | PRN
  Filled 2014-06-22: qty 30

## 2014-06-22 MED ORDER — SIMETHICONE 80 MG PO CHEW: 80.0000 mg | CHEWABLE_TABLET | ORAL | Status: DC | PRN

## 2014-06-22 MED ORDER — FLEET ENEMA 7-19 GM/118ML RE ENEM: 1.0000 | ENEMA | RECTAL | Status: DC | PRN

## 2014-06-22 MED ORDER — OXYTOCIN BOLUS FROM INFUSION
500.0000 mL | INTRAVENOUS | Status: DC
  Administered 2014-06-22: 500 mL via INTRAVENOUS

## 2014-06-22 MED ORDER — LANOLIN HYDROUS EX OINT: TOPICAL_OINTMENT | CUTANEOUS | Status: DC | PRN

## 2014-06-22 MED ORDER — OXYCODONE-ACETAMINOPHEN 5-325 MG PO TABS: 1.0000 | ORAL_TABLET | ORAL | Status: DC | PRN

## 2014-06-22 MED ORDER — ACETAMINOPHEN 325 MG PO TABS: 650.0000 mg | ORAL_TABLET | ORAL | Status: DC | PRN

## 2014-06-22 MED ORDER — CITRIC ACID-SODIUM CITRATE 334-500 MG/5ML PO SOLN: 30.0000 mL | ORAL | Status: DC | PRN

## 2014-06-22 MED ORDER — ONDANSETRON HCL 4 MG PO TABS: 4.0000 mg | ORAL_TABLET | ORAL | Status: DC | PRN

## 2014-06-22 MED ORDER — FENTANYL 2.5 MCG/ML BUPIVACAINE 1/10 % EPIDURAL INFUSION (WH - ANES)
INTRAMUSCULAR | Status: DC
Start: 2014-06-22 — End: 2014-06-22
  Filled 2014-06-22: qty 125

## 2014-06-22 MED ORDER — FENTANYL 2.5 MCG/ML BUPIVACAINE 1/10 % EPIDURAL INFUSION (WH - ANES)
14.0000 mL/h | INTRAMUSCULAR | Status: DC | PRN
  Administered 2014-06-22: 14 mL/h via EPIDURAL

## 2014-06-22 MED ORDER — AMPICILLIN SODIUM 2 G IJ SOLR
2.0000 g | Freq: Once | INTRAMUSCULAR | Status: AC
  Administered 2014-06-22: 2 g via INTRAVENOUS
  Filled 2014-06-22: qty 2000

## 2014-06-22 MED ORDER — TETANUS-DIPHTH-ACELL PERTUSSIS 5-2.5-18.5 LF-MCG/0.5 IM SUSP
0.5000 mL | Freq: Once | INTRAMUSCULAR | Status: AC
  Administered 2014-06-23: 0.5 mL via INTRAMUSCULAR
  Filled 2014-06-22: qty 0.5

## 2014-06-22 MED ORDER — OXYTOCIN 40 UNITS IN LACTATED RINGERS INFUSION - SIMPLE MED
62.5000 mL/h | INTRAVENOUS | Status: DC
  Filled 2014-06-22: qty 1000

## 2014-06-22 MED ORDER — BENZOCAINE-MENTHOL 20-0.5 % EX AERO: 1.0000 "application " | INHALATION_SPRAY | CUTANEOUS | Status: DC | PRN

## 2014-06-22 MED ORDER — OXYCODONE-ACETAMINOPHEN 5-325 MG PO TABS: 2.0000 | ORAL_TABLET | ORAL | Status: DC | PRN

## 2014-06-22 MED ORDER — DIPHENHYDRAMINE HCL 50 MG/ML IJ SOLN: 12.5000 mg | INTRAMUSCULAR | Status: DC | PRN

## 2014-06-22 MED ORDER — PRENATAL MULTIVITAMIN CH
1.0000 | ORAL_TABLET | Freq: Every day | ORAL | Status: DC
  Administered 2014-06-23 – 2014-06-24 (×2): 1 via ORAL
  Filled 2014-06-22 (×2): qty 1

## 2014-06-22 MED ORDER — LACTATED RINGERS IV SOLN
500.0000 mL | INTRAVENOUS | Status: DC | PRN
Start: 2014-06-22 — End: 2014-06-22

## 2014-06-22 MED ORDER — WITCH HAZEL-GLYCERIN EX PADS: 1.0000 "application " | MEDICATED_PAD | CUTANEOUS | Status: DC | PRN

## 2014-06-22 MED ORDER — IBUPROFEN 600 MG PO TABS
600.0000 mg | ORAL_TABLET | Freq: Four times a day (QID) | ORAL | Status: DC
  Administered 2014-06-22 – 2014-06-24 (×8): 600 mg via ORAL
  Filled 2014-06-22 (×8): qty 1

## 2014-06-22 MED ORDER — ZOLPIDEM TARTRATE 5 MG PO TABS: 5.0000 mg | ORAL_TABLET | Freq: Every evening | ORAL | Status: DC | PRN

## 2014-06-22 MED ORDER — DIBUCAINE 1 % RE OINT: 1.0000 "application " | TOPICAL_OINTMENT | RECTAL | Status: DC | PRN

## 2014-06-22 MED ORDER — DIPHENHYDRAMINE HCL 25 MG PO CAPS: 25.0000 mg | ORAL_CAPSULE | Freq: Four times a day (QID) | ORAL | Status: DC | PRN

## 2014-06-22 MED ORDER — LACTATED RINGERS IV SOLN
500.0000 mL | Freq: Once | INTRAVENOUS | Status: AC
  Administered 2014-06-22: 500 mL via INTRAVENOUS

## 2014-06-22 MED ORDER — PHENYLEPHRINE 40 MCG/ML (10ML) SYRINGE FOR IV PUSH (FOR BLOOD PRESSURE SUPPORT)
PREFILLED_SYRINGE | INTRAVENOUS | Status: AC
  Filled 2014-06-22: qty 10

## 2014-06-22 MED ORDER — LIDOCAINE HCL (PF) 1 % IJ SOLN
INTRAMUSCULAR | Status: DC | PRN
  Administered 2014-06-22 (×4): 4 mL

## 2014-06-22 NOTE — H&P (Signed)
Adriana Perry is a 18 y.o. female G1P0 with IUP at 5266w5d presenting for c/O contractions since 0600,  Now about 4 minutes apart , no vaginal bleeding.  Membranes are intact, with active fetal movement.   [facility]  Prenatal History/Complications:  + Trick 9/15.  POC  10/26 by J[facility]  Past Medical History: No past medical history on file.  Past Surgical History: No past surgical history on file.  Obstetrical History: OB History    Gravida Para Term Preterm AB TAB SAB Ectopic Multiple Living   1                 Social History: History   Social History  . Marital Status: Single    Spouse Name: N/A    Number of Children: N/A  . Years of Education: N/A   Social History Main Topics  . Smoking status: Former Games developermoker  . Smokeless tobacco: Never Used  . Alcohol Use: No  . Drug Use: No  . Sexual Activity: Not Currently   Other Topics Concern  . Not on file   Social History Narrative    Family History: Family History  Problem Relation Age of Onset  . Hypertension Paternal Grandfather   . Diabetes Paternal Grandfather   . Hypertension Paternal Grandmother   . Diabetes Paternal Grandmother   . Thyroid disease Paternal Grandmother   . Diabetes Maternal Grandfather   . Hypertension Maternal Grandfather     Allergies: No Known Allergies     Prenatal Transfer Tool  Maternal Diabetes: No Genetic Screening: Normal Maternal Ultrasounds/Referrals: Normal Fetal Ultrasounds or other Referrals:  None Maternal Substance Abuse:  No Significant Maternal Medications:  None Significant Maternal Lab Results: Lab values include: Group B Strep positive     Review of Systems   Constitutional: Negative for fever, chills, weight loss, malaise/fatigue and diaphoresis.  HENT: Negative for hearing loss, ear pain, nosebleeds, congestion, sore throat, neck pain, tinnitus and ear discharge.   Eyes: Negative for blurred vision, double vision, photophobia,  pain, discharge and redness.  Respiratory: Negative for cough, hemoptysis, sputum production, shortness of breath, wheezing and stridor.   Cardiovascular: Negative for chest pain, palpitations, orthopnea,  leg swelling  Gastrointestinal: Positive for abdominal pain. Negative for heartburn, nausea, vomiting, diarrhea, constipation, blood in stool Genitourinary: Negative for dysuria, urgency, frequency, hematuria and flank pain.  Musculoskeletal: Negative for myalgias, back pain, joint pain and falls.  Skin: Negative for itching and rash.  Neurological: Negative for dizziness, tingling, tremors, sensory change, speech change, focal weakness, seizures, loss of consciousness, weakness and headaches.  Endo/Heme/Allergies: Negative for environmental allergies and polydipsia. Does not bruise/bleed easily.  Psychiatric/Behavioral: Negative for depression, suicidal ideas, hallucinations, memory loss and substance abuse. The patient is not nervous/anxious and does not have insomnia.       Last menstrual period 09/01/2013. General appearance: alert, cooperative and no distress.  Breathing through contractions, q 4-5 minutes Lungs: clear to auscultation bilaterally Heart: regular rate and rhythm Abdomen: soft, non-tender; bowel sounds normal Pelvic: 7/90/0 Extremities: Homans sign is negative, no sign of DVT DTR's 2+ Presentation: cephalic     Prenatal labs: ABO, Rh: O/POS/-- (03/25 1550) Antibody: NEG (08/18 0916) Rubella:   RPR: NON REAC (08/18 0916)  HBsAg: NEGATIVE (03/25 1550)  HIV: NONREACTIVE (08/18 0916)  GBS: Detected (10/26 1248)  2 hr Glucola 69/79/50 Genetic screening  Normal NT/IT Anatomy US normal female   No results found for this or any previous visit (from the  past 24 hour(s)).  Assessment: Adriana Perry is a 18 y.o. G1P0 with an IUP at 1167w5d presenting for active labor  Plan: #Labor: active #Pain:  IV vs Epidural #FWB pending. Active fetus with FHR 148-158 during  and after a contraction #ID: GBS: positive  #MOF:  breast #MOC: undecided #[facility]   CRESENZO-DISHMAN,Alisan Dokes 06/22/2014, 10:17 AM

## 2014-06-22 NOTE — Anesthesia Preprocedure Evaluation (Signed)
Anesthesia Evaluation  Patient identified by MRN, date of birth, ID band Patient awake    Reviewed: Allergy & Precautions, H&P , NPO status , Patient's Chart, lab work & pertinent test results, reviewed documented beta blocker date and time   History of Anesthesia Complications Negative for: history of anesthetic complications  Airway Mallampati: III TM Distance: >3 FB Neck ROM: full    Dental  (+) Teeth Intact   Pulmonary neg pulmonary ROS, former smoker,  breath sounds clear to auscultation        Cardiovascular negative cardio ROS  Rhythm:regular Rate:Normal     Neuro/Psych negative neurological ROS  negative psych ROS   GI/Hepatic negative GI ROS, Neg liver ROS,   Endo/Other  negative endocrine ROS  Renal/GU negative Renal ROS     Musculoskeletal   Abdominal   Peds  Hematology negative hematology ROS (+)   Anesthesia Other Findings   Reproductive/Obstetrics (+) Pregnancy                           Anesthesia Physical  Anesthesia Plan  ASA: II  Anesthesia Plan: Epidural   Post-op Pain Management:    Induction:   Airway Management Planned:   Additional Equipment:   Intra-op Plan:   Post-operative Plan:   Informed Consent: I have reviewed the patients History and Physical, chart, labs and discussed the procedure including the risks, benefits and alternatives for the proposed anesthesia with the patient or authorized representative who has indicated his/her understanding and acceptance.     Plan Discussed with:   Anesthesia Plan Comments:        Anesthesia Quick Evaluation  

## 2014-06-22 NOTE — Progress Notes (Signed)
[facility]  H&P done under admission notes

## 2014-06-22 NOTE — Anesthesia Procedure Notes (Signed)
Epidural Patient location during procedure: OB Start time: 06/22/2014 1:19 PM  Staffing Anesthesiologist: Ricarda Atayde Performed by: anesthesiologist   Preanesthetic Checklist Completed: patient identified, site marked, surgical consent, pre-op evaluation, timeout performed, IV checked, risks and benefits discussed and monitors and equipment checked  Epidural Patient position: sitting Prep: site prepped and draped and DuraPrep Patient monitoring: continuous pulse ox and blood pressure Approach: midline Location: L3-L4 Injection technique: LOR air  Needle:  Needle type: Tuohy  Needle gauge: 17 G Needle length: 9 cm and 9 Needle insertion depth: 7.5 cm Catheter type: closed end flexible Catheter size: 19 Gauge Catheter at skin depth: 12.5 cm Test dose: negative  Assessment Events: blood not aspirated, injection not painful, no injection resistance, negative IV test and no paresthesia  Additional Notes Discussed risk of headache, infection, bleeding, nerve injury and failed or incomplete block.  Patient voices understanding and wishes to proceed.  Epidural placed easily on first attempt. No paresthesia.  Patient tolerated procedure well with no apparent complications.  Jasmine DecemberA. Channel Papandrea, MDReason for block:procedure for pain

## 2014-06-23 NOTE — Progress Notes (Signed)
Post Partum Day 1 Subjective: no complaints, up ad lib, voiding and tolerating PO, small lochia, plans to bottle feed, Nexplanon  Objective: Blood pressure 101/66, pulse 90, temperature 98.5 F (36.9 C), temperature source Oral, resp. rate 18, height 5\' 4"  (1.626 m), weight 87.544 kg (193 lb), last menstrual period 09/01/2013, SpO2 100 %, unknown if currently breastfeeding.  Physical Exam:  General: alert, cooperative and no distress Lochia:normal flow Chest: CTAB Heart: RRR no m/r/g Abdomen: +BS, soft, nontender,  Uterine Fundus: firm DVT Evaluation: No evidence of DVT seen on physical exam. Extremities: no edema   Recent Labs  06/22/14 1110  HGB 11.7*  HCT 33.7*    Assessment/Plan: Plan for discharge tomorrow   LOS: 1 day   CRESENZO-DISHMAN,Adriana Perry 06/23/2014, 7:16 AM

## 2014-06-23 NOTE — Plan of Care (Signed)
Problem: Consults Goal: Postpartum Patient Education (See Patient Education module for education specifics.)  Outcome: Completed/Met Date Met:  06/23/14  Problem: Phase I Progression Outcomes Goal: Pain controlled with appropriate interventions Outcome: Completed/Met Date Met:  06/23/14 Goal: Voiding adequately Outcome: Completed/Met Date Met:  06/23/14 Goal: OOB as tolerated unless otherwise ordered Outcome: Completed/Met Date Met:  06/23/14 Goal: VS, stable, temp < 100.4 degrees F Outcome: Completed/Met Date Met:  06/23/14 Goal: Initial discharge plan identified Outcome: Completed/Met Date Met:  06/23/14 Goal: Other Phase I Outcomes/Goals Outcome: Completed/Met Date Met:  06/23/14  Problem: Phase II Progression Outcomes Goal: Pain controlled on oral analgesia Outcome: Completed/Met Date Met:  06/23/14 Goal: Progress activity as tolerated unless otherwise ordered Outcome: Completed/Met Date Met:  06/23/14 Goal: Tolerating diet Outcome: Completed/Met Date Met:  06/23/14

## 2014-06-23 NOTE — Plan of Care (Signed)
Problem: Phase II Progression Outcomes Goal: Afebrile, VS remain stable Outcome: Completed/Met Date Met:  06/23/14 Goal: Other Phase II Outcomes/Goals Outcome: Not Applicable Date Met:  94/80/16

## 2014-06-23 NOTE — Progress Notes (Signed)
Clinical Social Work Department PSYCHOSOCIAL ASSESSMENT - MATERNAL/CHILD 06/23/2014  Patient:  Adriana Perry, Adriana Perry  Account Number:  [id number]  [place] Date:  06/22/2014  [name] Name:   Adriana Perry    Clinical Social Worker:  Bryn Gulling, CLINICAL SOCIAL WORKER   Date/Time:  06/23/2014 04:15 PM  Date Referred:  06/23/2014   Referral source  CN     Referred reason  Substance Abuse   Other referral source:    I:  FAMILY / Perry legal guardian:  PARENT  Guardian - Name Guardian - Age Guardian - Address  Adriana Perry Adriana Perry, [place] [address]   Other household support members/support persons Name Relationship DOB   MOTHER    SISTER 62 years old   SISTER 80 years old   Other support:   FOB and PGM are also supports.    II  PSYCHOSOCIAL DATA Information Source:  Patient Interview  Occupational hygienist Employment:   Agricultural engineer resources:  [employer] If [place]:  [employer] / Grade:   Maternity Care Coordinator / Child Services Coordination / Early Interventions:  Cultural issues impacting care:   None stated.    III  STRENGTHS Strengths  Adequate Resources  Home prepared for Child (including basic supplies)  Supportive family/friends   Strength comment:  MOB and FOB have a strong supportive relationship. FOB works in Architect and will be available to help with baby. MOB lives with immediate family who will be there to assist with childcare. MOB is currently looking for work.   IV  RISK FACTORS AND CURRENT PROBLEMS Current Problem:  YES   Risk Factor & Current Problem Patient Issue Family Issue Risk Factor / Current Problem Comment  Substance Abuse Y N History of marijuana use    V  SOCIAL WORK ASSESSMENT MSW intern met with MOB first floor/room 101 to complete assessment due to history of marijuana use. MSW intern entered room to find MOB in bed with baby and FOB in chair bedside.  MOB's affect was alert and appropriate for the setting. MOB's mood seemed quiet and guarded. MSW intern asked MOB how she was feeling now that baby is here and this is her first one. MOB replied, "Fine. I'm happy." FOB interjected, "We're so excited. I've just been joyful." MSW intern asked MOB about support. MOB reported that she is living with her mother and her two sisters and they will be able to help take care of baby. MOB also reported that FOB and his family are strong supports as well. MSW intern asked if MOB felt like she had everything prepared at home for baby, she reported yes. MSW intern asked MOB and FOB about employment, FOB reported that he works Architect and MOB stated that she will be looking for a job when she gets home. MSW intern inquired MOB's first thought when she found out she was pregnant, before MOB could answer FOB interjected, "I was ecstatic." MOB replied, "I was happy." MSW intern asked MOB about her moods throughout her pregnancy, she disclosed she had no change in mood. MSW intern provided PPD education and answered questions to MOB's satisfaction. MSW intern informed MOB about hospital drug screen policy and that baby had been drug screened due to MOB's history of marijuana use. MOB voiced understanding and stated she had no further questions or concerns. MSW intern reports no barriers to discharge as this time.     [place]  MSW intern reports  no barriers to discharge.   Type of pt/family education:   If child protective services report - county:   If child protective services report - date:   Information/referral to community resources comment:   Other social work plan:

## 2014-06-23 NOTE — Lactation Note (Signed)
This note was copied from the chart of Adriana Susy Frizzleshley Raatz. Lactation Consultation Note New mom has decided to pump and bottle feed baby. Had hand pump, gave DEBP noted good colostrum. Encouraged to give colostrum first then she can bottle feed supplement. Mom c/o soreness to nipples. No bruising noted. Comfort gels given. Mom has large pendulum breast. Mom encouraged to feed baby 8-12 times/24 hours and with feeding cues. Hand expression taught to Mom. Mom encouraged to do skin-to-skin. Mom knows to pump q3h for 15-20 min. Referred to Baby and Me Book in Breastfeeding section Pg. 22-23 for position options and Proper latch demonstration.WH/LC brochure given w/resources, support groups and LC services.Mom shown how to use DEBP & how to disassemble, clean, & reassemble parts. Patient Name: Adriana Perry ZOXWR'UToday's Date: 06/23/2014 Reason for consult: Initial assessment   Maternal Data Has patient been taught Hand Expression?: Yes Does the patient have breastfeeding experience prior to this delivery?: No  Feeding Feeding Type: Breast Milk with Formula added Nipple Type: Slow - flow  LATCH Score/Interventions                Intervention(s): Breastfeeding basics reviewed;Support Pillows;Position options;Skin to skin     Lactation Tools Discussed/Used Breast pump type: Double-Electric Breast Pump WIC Program: Yes Pump Review: Setup, frequency, and cleaning;Milk Storage Initiated by:: Peri JeffersonL. Aubria Vanecek RN Date initiated:: 06/23/14   Consult Status Consult Status: Follow-up Date: 06/23/14 Follow-up type: In-patient    Camaria Gerald, Diamond NickelLAURA G 06/23/2014, 6:11 AM

## 2014-06-23 NOTE — Anesthesia Postprocedure Evaluation (Signed)
  Anesthesia Post-op Note  Patient: Adriana Perry  Procedure(s) Performed: * No procedures listed *  Patient Location: PACU and Mother/Baby  Anesthesia Type:Epidural  Level of Consciousness: awake, alert  and oriented  Airway and Oxygen Therapy: Patient Spontanous Breathing  Post-op Pain: mild  Post-op Assessment: Post-op Vital signs reviewed, Patient's Cardiovascular Status Stable, Respiratory Function Stable, No signs of Nausea or vomiting, Adequate PO intake, Pain level controlled, No headache, No backache, No residual numbness and No residual motor weakness  Post-op Vital Signs: Reviewed and stable  Last Vitals:  Filed Vitals:   06/23/14 0534  BP: 101/66  Pulse: 90  Temp: 36.9 C  Resp:     Complications: No apparent anesthesia complications

## 2014-06-23 NOTE — Progress Notes (Signed)
UR chart review completed.  

## 2014-06-24 MED ORDER — IBUPROFEN 600 MG PO TABS: 600.0000 mg | ORAL_TABLET | Freq: Four times a day (QID) | ORAL | Status: DC

## 2014-06-24 NOTE — Discharge Instructions (Signed)

## 2014-06-24 NOTE — Plan of Care (Signed)
Problem: Discharge Progression Outcomes Goal: Barriers To Progression Addressed/Resolved Outcome: Completed/Met Date Met:  94/83/47 Goal: Complications resolved/controlled Outcome: Completed/Met Date Met:  06/24/14 Goal: Pain controlled with appropriate interventions Outcome: Completed/Met Date Met:  06/24/14 Goal: Afebrile, VS remain stable at discharge Outcome: Completed/Met Date Met:  06/24/14 Goal: Discharge plan in place and appropriate Outcome: Completed/Met Date Met:  06/24/14 Goal: Other Discharge Outcomes/Goals Outcome: Completed/Met Date Met:  06/24/14

## 2014-06-24 NOTE — Lactation Note (Signed)
This note was copied from the chart of Adriana Perry Mallick. Lactation Consultation Note  Patient Name: Adriana Perry Wessinger ZOXWR'UToday's Date: 06/24/2014 Reason for consult: Follow-up assessment;Infant < 6lbs Baby 41 hours of life. Mom bottle-feeding baby with formula when LC entered room. Mom states her goals are "probably" to feed the baby with a combination of nursing and bottle-feeding both EBM and formula. Discussed supply and demand, and the need to nurse in order to protect supply and keep baby interested in nursing at breast. Mom states that she had a DEBP at home. Mom states that her breasts are filling. Discussed engorgement prevention/treatment. Referred parents to Baby and Me booklet for number of diapers to expect and EBM storage guidelines. Discussed returning to school and nursing/pumping. Mom aware of OP/BFSG and LC phone line assistance.   Maternal Data    Feeding Feeding Type:  (Mom bottle-feeding formula when LC entered room.)  LATCH Score/Interventions                      Lactation Tools Discussed/Used     Consult Status Consult Status: Complete    Laporsche Hoeger 06/24/2014, 9:04 AM

## 2014-06-24 NOTE — Plan of Care (Signed)
Problem: Discharge Progression Outcomes Goal: Activity appropriate for discharge plan Outcome: Completed/Met Date Met:  06/24/14 Goal: Tolerating diet Outcome: Completed/Met Date Met:  06/24/14

## 2014-06-24 NOTE — Discharge Summary (Signed)
Obstetric Discharge Summary Reason for Admission: onset of labor Prenatal Procedures: none Intrapartum Procedures: spontaneous vaginal delivery Postpartum Procedures: none Complications-Operative and Postpartum: bilateral labial laceration, no suture repair  Hospital Course:  Patient presented in active labor. She progressed well without complication.   Delivery Note At 3:05 PM a viable female was delivered via Vaginal, Spontaneous Delivery (Presentation: Left Occiput Anterior). APGAR: 9, 9; weight pending .  Placenta status: Intact, Spontaneous. Cord: 3 vessels with the following complications: None. Cord pH: n/a  Anesthesia: Epidural  Episiotomy: None Lacerations: Bilateral Labial Suture Repair: none, laceration was hemostatic and patient did not desire suture repair Est. Blood Loss (mL): 200  Mom to postpartum. Baby to Couplet care / Skin to Skin.  Jacquiline Doearker, Caleb 06/22/2014, 3:59 PM   I was gloved and present for delivery in its entirety. Second stage of labor progressed, baby delivered after 20-30 minutes of pushing. no decels during second stage noted. Complications: none Lacerations: bilateral labial, discussed that they were both well approximated and hemostatic, as such patient declined repair EBL: 200  ACOSTA,KRISTY ROCIO, MD 8:09 PM   Postpartum, the patient did well with no complications.  On the day of discharge,  Pt denied problems with ambulating, voiding or po intake.  She denied nausea or vomiting.  Pain was well controlled.  She has had flatus. She has not had bowel movement.  Lochia Small.  Plan for birth control is nexplanon.    H/H: Lab Results  Component Value Date/Time   HGB 11.7* 06/22/2014 11:10 AM   HCT 33.7* 06/22/2014 11:10 AM    Filed Vitals:   06/24/14 0528  BP: 107/59  Pulse: 80  Temp: 98.3 F (36.8 C)  Resp: 20    Physical Exam: VSS NAD Abd: Appropriately tender, ND, Fundus firm No  c/c/e, Neg homan's sign, neg cords Lochia Appropriate  Discharge Diagnoses: Term Pregnancy-delivered  Discharge Information: Date: 06/24/2014 Activity: pelvic rest Diet: routine  Medications: PNV and Ibuprofen Breast feeding:  Yes Condition: stable Instructions: refer to handout Discharge to: home      Medication List    TAKE these medications        FLINTSTONES COMPLETE PO  Take 2 tablets by mouth daily. Takes 2 daily     ibuprofen 600 MG tablet  Commonly known as:  ADVIL,MOTRIN  Take 1 tablet (600 mg total) by mouth every 6 (six) hours.       Follow-up Information    Follow up with [facility]. Schedule an appointment as soon as possible for a visit in 6 weeks.   Why:  for postpartum follow up   Contact information:   [address] [place] [phone]      Jacquiline Doearker, Caleb 06/24/2014,9:01 AM  Seen also by me Agree with note Aviva SignsMarie L Kaydon Creedon, CNM

## 2014-08-24 ENCOUNTER — Encounter: Payer: Self-pay | Admitting: Women's Health

## 2014-08-24 ENCOUNTER — Ambulatory Visit: Payer: Medicaid Other | Admitting: Women's Health

## 2015-07-18 ENCOUNTER — Ambulatory Visit (INDEPENDENT_AMBULATORY_CARE_PROVIDER_SITE_OTHER): Payer: Self-pay | Admitting: Women's Health

## 2015-07-18 ENCOUNTER — Encounter: Payer: Self-pay | Admitting: Women's Health

## 2015-07-18 VITALS — BP 128/62 | HR 84 | Wt 175.0 lb

## 2015-07-18 DIAGNOSIS — O4691 Antepartum hemorrhage, unspecified, first trimester: Secondary | ICD-10-CM

## 2015-07-18 DIAGNOSIS — Z3200 Encounter for pregnancy test, result unknown: Secondary | ICD-10-CM

## 2015-07-18 DIAGNOSIS — O3680X0 Pregnancy with inconclusive fetal viability, not applicable or unspecified: Secondary | ICD-10-CM

## 2015-07-18 DIAGNOSIS — Z3490 Encounter for supervision of normal pregnancy, unspecified, unspecified trimester: Secondary | ICD-10-CM

## 2015-07-18 DIAGNOSIS — Z3202 Encounter for pregnancy test, result negative: Secondary | ICD-10-CM

## 2015-07-18 DIAGNOSIS — O209 Hemorrhage in early pregnancy, unspecified: Secondary | ICD-10-CM

## 2015-07-18 LAB — POCT URINE PREGNANCY: Preg Test, Ur: POSITIVE — AB

## 2015-07-18 MED ORDER — CITRANATAL ASSURE 35-1 & 300 MG PO MISC: ORAL | Status: DC

## 2015-07-18 NOTE — Progress Notes (Signed)
Patient ID: Adriana Perry, female   DOB: May 21, 1996, 21 y.o.   MRN: [id number]   [facility]  Patient name: Adriana Frizzleshley Chesler MRN [id number]  Date of birth: May 21, 1996  CC & HPI:  Adriana Frizzleshley Bear is a 19 y.o. [id number] African American female presenting today for report of 5 +HPT, Patient's last menstrual period was 05/23/2015. States she started spotting Sunday, had 'a period' yesterday- so thought she really wasn't pregnant. No bleeding today. No cramping/abd/pelvic pain, no n/v, breast soreness. Was not planning pregnancy, however was not using any form of contraception. Her baby just turned 1yo at the beginning of Nov. Not taking pnv.   Patient's last menstrual period was 05/23/2015.  Pertinent History Reviewed:  Medical & Surgical Hx:   Past Medical History  Diagnosis Date  . Medical history non-contributory    History reviewed. No pertinent past surgical history. Medications: Reviewed & Updated - see associated section Social History: Reviewed -  reports that she has been smoking.  She has never used smokeless tobacco.  Objective Findings:  Vitals: BP 128/62 mmHg  Pulse 84  Wt 175 lb (79.379 kg)  LMP 05/23/2015  Breastfeeding? No Body mass index is 30.02 kg/(m^2).  Physical Examination: General appearance - alert, well appearing, and in no distress Pelvic exam deferred- pt reports vb has resolved  Results for orders placed or performed in visit on 07/18/15 (from the past 24 hour(s))  POCT urine pregnancy   Collection Time: 07/18/15 10:21 AM  Result Value Ref Range   Preg Test, Ur Positive (A) Negative     Assessment & Plan:  A:   ~8wks by LMP  1st trimester vb, none today  P:  Rh pos  Rx pnv to begin today  Reviewed reasons to seek care- return of vb/abd pain/cramping prior to u/s  Return in about 1 week (around 07/25/2015) for new ob u/s, then 1wk later for new ob visit.   Pregnancy verification form given  Marge DuncansBooker, Hoyle Barkdull Randall CNM,  [facility] 07/18/2015 10:22 AM

## 2015-07-18 NOTE — Patient Instructions (Signed)

## 2015-07-24 ENCOUNTER — Encounter (HOSPITAL_COMMUNITY): Payer: Self-pay | Admitting: Emergency Medicine

## 2015-07-24 ENCOUNTER — Emergency Department (HOSPITAL_COMMUNITY)
Admission: EM | Admit: 2015-07-24 | Discharge: 2015-07-24 | Disposition: A | Discharge status: Home / Self Care | Payer: Medicaid Other | Attending: Emergency Medicine | Admitting: Emergency Medicine

## 2015-07-24 ENCOUNTER — Emergency Department (HOSPITAL_COMMUNITY): Payer: Medicaid Other

## 2015-07-24 DIAGNOSIS — O209 Hemorrhage in early pregnancy, unspecified: Secondary | ICD-10-CM | POA: Diagnosis present

## 2015-07-24 DIAGNOSIS — O4690 Antepartum hemorrhage, unspecified, unspecified trimester: Secondary | ICD-10-CM

## 2015-07-24 DIAGNOSIS — F17200 Nicotine dependence, unspecified, uncomplicated: Secondary | ICD-10-CM | POA: Insufficient documentation

## 2015-07-24 DIAGNOSIS — Z3A08 8 weeks gestation of pregnancy: Secondary | ICD-10-CM | POA: Insufficient documentation

## 2015-07-24 DIAGNOSIS — O99331 Smoking (tobacco) complicating pregnancy, first trimester: Secondary | ICD-10-CM | POA: Insufficient documentation

## 2015-07-24 DIAGNOSIS — O039 Complete or unspecified spontaneous abortion without complication: Secondary | ICD-10-CM

## 2015-07-24 DIAGNOSIS — O200 Threatened abortion: Secondary | ICD-10-CM | POA: Diagnosis not present

## 2015-07-24 DIAGNOSIS — N939 Abnormal uterine and vaginal bleeding, unspecified: Secondary | ICD-10-CM

## 2015-07-24 LAB — CBC WITH DIFFERENTIAL/PLATELET
BASOS PCT: 0 %
Basophils Absolute: 0 10*3/uL (ref 0.0–0.1)
EOS ABS: 0.3 10*3/uL (ref 0.0–0.7)
EOS PCT: 3 %
HCT: 39.2 % (ref 36.0–46.0)
Hemoglobin: 13.4 g/dL (ref 12.0–15.0)
LYMPHS PCT: 22 %
Lymphs Abs: 2.2 10*3/uL (ref 0.7–4.0)
MCH: 32.8 pg (ref 26.0–34.0)
MCHC: 34.2 g/dL (ref 30.0–36.0)
MCV: 95.8 fL (ref 78.0–100.0)
MONO ABS: 0.8 10*3/uL (ref 0.1–1.0)
Monocytes Relative: 8 %
Neutro Abs: 6.8 10*3/uL (ref 1.7–7.7)
Neutrophils Relative %: 67 %
Platelets: 335 10*3/uL (ref 150–400)
RBC: 4.09 MIL/uL (ref 3.87–5.11)
RDW: 12.5 % (ref 11.5–15.5)
WBC: 10 10*3/uL (ref 4.0–10.5)

## 2015-07-24 LAB — HCG, QUANTITATIVE, PREGNANCY: HCG, BETA CHAIN, QUANT, S: 40 m[IU]/mL — AB (ref <5)

## 2015-07-24 NOTE — ED Provider Notes (Signed)
CSN: [id number]     Arrival date & time 07/24/15  0848 History  By signing my name below, I, Adriana Perry, attest that this documentation has been prepared under the direction and in the presence of Rolland Porter, MD.  Electronically Signed: Gwenyth Perry, ED Scribe. 07/24/2015. 9:08 AM.  Chief Complaint  Patient presents with  . Vaginal Bleeding    9 weeks/due July 11th 2017   The history is provided by the patient. No language interpreter was used.    HPI Comments: Adriana Perry is a 19 y.o. female G2P1 who presents to the Emergency Department complaining of constant, mild vaginal bleeding that started 1 week ago, resolved after 2 days and started again 2 days ago. Pt reports intermittent abdominal cramping and lower back pain that started 2 days ago as associated symptoms. The abdominal pain was not initially present with onset of bleeding 1 week ago. Pt was seen by her CNM last week who confirmed pregnancy and scheduled her for a new OB US this week. Pt's LMP was on 10/4. She has a 10-year old child at home. Pt denies fever.  Past Medical History  Diagnosis Date  . Medical history non-contributory    History reviewed. No pertinent past surgical history. Family History  Problem Relation Age of Onset  . Hypertension Paternal Grandfather   . Diabetes Paternal Grandfather   . Hypertension Paternal Grandmother   . Diabetes Paternal Grandmother   . Thyroid disease Paternal Grandmother   . Diabetes Maternal Grandfather   . Hypertension Maternal Grandfather    Social History  Substance Use Topics  . Smoking status: Current Every Day Smoker -- 0.25 packs/day  . Smokeless tobacco: Never Used  . Alcohol Use: No   OB History    Gravida Para Term Preterm AB TAB SAB Ectopic Multiple Living   0 1     Review of Systems  Constitutional: Negative for fever, chills, diaphoresis, appetite change and fatigue.  HENT: Negative for mouth sores, sore throat and trouble swallowing.    Eyes: Negative for visual disturbance.  Respiratory: Negative for cough, chest tightness, shortness of breath and wheezing.   Cardiovascular: Negative for chest pain.  Gastrointestinal: Positive for abdominal pain. Negative for nausea, vomiting and diarrhea.  Endocrine: Negative for polydipsia, polyphagia and polyuria.  Genitourinary: Positive for vaginal bleeding. Negative for dysuria, frequency and hematuria.  Musculoskeletal: Positive for back pain. Negative for gait problem.  Skin: Negative for color change, pallor and rash.  Neurological: Negative for dizziness, syncope, light-headedness and headaches.  Hematological: Does not bruise/bleed easily.  Psychiatric/Behavioral: Negative for behavioral problems and confusion.   Closed os on pelvic exam    Allergies  Review of patient's allergies indicates no known allergies.  Home Medications   Prior to Admission medications   Not on File   BP 135/86 mmHg  Pulse 85  Temp(Src) 98.5 F (36.9 C) (Oral)  Resp 18  Ht  (1.626 m)  Wt 175 lb (79.379 kg)  BMI 30.02 kg/m2  SpO2 98%  LMP 05/23/2015 Physical Exam  Constitutional: She is oriented to person, place, and time. She appears well-developed and well-nourished. No distress.  HENT:  Head: Normocephalic.  Eyes: Conjunctivae are normal. Pupils are equal, round, and reactive to light. No scleral icterus.  Neck: Normal range of motion. Neck supple. No thyromegaly present.  Cardiovascular: Normal rate and regular rhythm.  Exam reveals no gallop and no friction rub.   No murmur heard.  Pulmonary/Chest: Effort normal and breath sounds normal. No respiratory distress. She has no wheezes. She has no rales.  Abdominal: Soft. Bowel sounds are normal. She exhibits no distension. There is tenderness ( Suprapubic midline tenderness). There is no rebound.  Musculoskeletal: Normal range of motion.  Neurological: She is alert and oriented to person, place, and time.  Skin: Skin is warm and  dry. No rash noted.  Psychiatric: She has a normal mood and affect. Her behavior is normal.  Nursing note and vitals reviewed.   ED Course  Procedures  DIAGNOSTIC STUDIES: Oxygen Saturation is 96% on RA, normal by my interpretation.    COORDINATION OF CARE: 9:10 AM Discussed treatment plan with pt which includes US to assess location and viability of pregnancy. She agreed to plan.  Labs Review Labs Reviewed  HCG, QUANTITATIVE, PREGNANCY - Abnormal; Notable for the following:    hCG, Beta Chain, Quant, S 40 (*)    All other components within normal limits  CBC WITH DIFFERENTIAL/PLATELET    Imaging Review [place] Ob Comp Less 14 Wks  07/24/2015  CLINICAL DATA:  Positive pregnancy test, 1 week of vaginal bleeding, lower abdominal pain for the past 2 days, quantitative beta HCG is 40. EXAM: OBSTETRIC <14 WK US AND TRANSVAGINAL OB US TECHNIQUE: Both transabdominal and transvaginal ultrasound examinations were performed for complete evaluation of the gestation as well as the maternal uterus, adnexal regions, and pelvic cul-de-sac. Transvaginal technique was performed to assess early pregnancy. COMPARISON:  None. FINDINGS: Intrauterine gestational sac: None visualized Yolk sac:  None visualized Embryo:  None visualized Cardiac Activity: None visualized Heart Rate: n/a  bpm Intrauterine gestational sac is observed. The right ovary measures 3.7 x 2 x 3.3 cm and the left ovary measures 2.7 x 2.1 x 2.7 cm. The echotexture of the ovaries is normal. The endometrial stripe measures 8 mm without evidence of endometrial fluid collections. There is no free pelvic fluid. IMPRESSION: No IUP is demonstrated. No cardiac activity is evident. No objective evidence of an ectopic pregnancy. The patient may have sustained spontaneous miscarriage. Follow-up ultrasound examinations are available as dictated by the patient's clinical status and serial beta HCG determinations. Electronically Signed   By: David  Swazil[place]Jordan M.D.    On: 07/24/2015 11:26   [place] Ob Transvaginal  07/24/2015  CLINICAL DATA:  Positive pregnancy test, 1 week of vaginal bleeding, lower abdominal pain for the past 2 days, quantitative beta HCG is 40. EXAM: OBSTETRIC <14 WK US AND TRANSVAGINAL OB US TECHNIQUE: Both transabdominal and transvaginal ultrasound examinations were performed for complete evaluation of the gestation as well as the maternal uterus, adnexal regions, and pelvic cul-de-sac. Transvaginal technique was performed to assess early pregnancy. COMPARISON:  None. FINDINGS: Intrauterine gestational sac: None visualized Yolk sac:  None visualized Embryo:  None visualized Cardiac Activity: None visualized Heart Rate: n/a  bpm Intrauterine gestational sac is observed. The right ovary measures 3.7 x 2 x 3.3 cm and the left ovary measures 2.7 x 2.1 x 2.7 cm. The echotexture of the ovaries is normal. The endometrial stripe measures 8 mm without evidence of endometrial fluid collections. There is no free pelvic fluid. IMPRESSION: No IUP is demonstrated. No cardiac activity is evident. No objective evidence of an ectopic pregnancy. The patient may have sustained spontaneous miscarriage. Follow-up ultrasound examinations are available as dictated by the patient's clinical status and serial beta HCG determinations. Electronically Signed   By: David  [place]Jordan M.D.   On: 07/24/2015 11:26   I have personally  reviewed and evaluated these images and lab results as part of my medical decision-making.   EKG Interpretation None      MDM   Final diagnoses:  Miscarriage  Vaginal bleeding    Pelvic exam shows closed os. Ultrasound suggest completion of miscarriage. Quantitative hCG 40. I discussed this at length with her. I recommended she undergo repeat Q hCG in 3 days. She has a pre-existing appointment in 3 days, December 8, with her OB/GYN.  I personally performed the services described in this documentation, which was scribed in my presence. The recorded  information has been reviewed and is accurate.    Rolland Porter, MD 07/24/15 [phone]

## 2015-07-24 NOTE — ED Notes (Signed)
Started with vaginal bleeding last Sunday for two days and [facility]  Bleeding stopped on Monday but have been having cramping and light bleeding since Thursday.  Rates pain 7/10 to abdomen.

## 2015-07-24 NOTE — ED Notes (Signed)
Patient with no complaints at this time. Respirations even and unlabored. Skin warm/dry. Discharge instructions reviewed with patient at this time. Patient given opportunity to voice concerns/ask questions. Patient discharged at this time and left Emergency Department with steady gait.   

## 2015-07-24 NOTE — ED Notes (Signed)
Pt comes in with vaginal bleeding starting last Sunday, stopping Monday. Starting back on Wednesday/Thursday with continuation into today. NAD noted. Pt does have pain to her lower abdomen.

## 2015-07-24 NOTE — Discharge Instructions (Signed)
Repeat her appointment with your OB on the eighth. ER physician recommends you have a repeat blood pregnancy test again in 3 days to confirm that your hormone level is dropping, confirming completion of the miscarriage.  Miscarriage A miscarriage is the sudden loss of an unborn baby (fetus) before the 20th week of pregnancy. Most miscarriages happen in the first 3 months of pregnancy. Sometimes, it happens before a woman even knows she is pregnant. A miscarriage is also called a "spontaneous miscarriage" or "early pregnancy loss." Having a miscarriage can be an emotional experience. Talk with your caregiver about any questions you may have about miscarrying, the grieving process, and your future pregnancy plans. CAUSES   Problems with the fetal chromosomes that make it impossible for the baby to develop normally. Problems with the baby's genes or chromosomes are most often the result of errors that occur, by chance, as the embryo divides and grows. The problems are not inherited from the parents.  Infection of the cervix or uterus.   Hormone problems.   Problems with the cervix, such as having an incompetent cervix. This is when the tissue in the cervix is not strong enough to hold the pregnancy.   Problems with the uterus, such as an abnormally shaped uterus, uterine fibroids, or congenital abnormalities.   Certain medical conditions.   Smoking, drinking alcohol, or taking illegal drugs.   Trauma.  Often, the cause of a miscarriage is unknown.  SYMPTOMS   Vaginal bleeding or spotting, with or without cramps or pain.  Pain or cramping in the abdomen or lower back.  Passing fluid, tissue, or blood clots from the vagina. DIAGNOSIS  Your caregiver will perform a physical exam. You may also have an ultrasound to confirm the miscarriage. Blood or urine tests may also be ordered. TREATMENT   Sometimes, treatment is not necessary if you naturally pass all the fetal tissue that was  in the uterus. If some of the fetus or placenta remains in the body (incomplete miscarriage), tissue left behind may become infected and must be removed. Usually, a dilation and curettage (D and C) procedure is performed. During a D and C procedure, the cervix is widened (dilated) and any remaining fetal or placental tissue is gently removed from the uterus.  Antibiotic medicines are prescribed if there is an infection. Other medicines may be given to reduce the size of the uterus (contract) if there is a lot of bleeding.  If you have Rh negative blood and your baby was Rh positive, you will need a Rh immunoglobulin shot. This shot will protect any future baby from having Rh blood problems in future pregnancies. HOME CARE INSTRUCTIONS   Your caregiver may order bed rest or may allow you to continue light activity. Resume activity as directed by your caregiver.  Have someone help with home and family responsibilities during this time.   Keep track of the number of sanitary pads you use each day and how soaked (saturated) they are. Write down this information.   Do not use tampons. Do not douche or have sexual intercourse until approved by your caregiver.   Only take over-the-counter or prescription medicines for pain or discomfort as directed by your caregiver.   Do not take aspirin. Aspirin can cause bleeding.   Keep all follow-up appointments with your caregiver.   If you or your partner have problems with grieving, talk to your caregiver or seek counseling to help cope with the pregnancy loss. Allow enough time to grieve  before trying to get pregnant again.  SEEK IMMEDIATE MEDICAL CARE IF:   You have severe cramps or pain in your back or abdomen.  You have a fever.  You pass large blood clots (walnut-sized or larger) ortissue from your vagina. Save any tissue for your caregiver to inspect.   Your bleeding increases.   You have a thick, bad-smelling vaginal  discharge.  You become lightheaded, weak, or you faint.   You have chills.  MAKE SURE YOU:  Understand these instructions.  Will watch your condition.  Will get help right away if you are not doing well or get worse.   This information is not intended to replace advice given to you by your health care provider. Make sure you discuss any questions you have with your health care provider.   Document Released: 01/29/2001 Document Revised: 11/30/2012 Document Reviewed: 09/24/2011 Elsevier Interactive Patient Education [employer]

## 2015-07-24 NOTE — ED Notes (Signed)
MD at bedside. 

## 2015-07-27 ENCOUNTER — Encounter: Payer: Self-pay | Admitting: *Deleted

## 2015-07-27 ENCOUNTER — Ambulatory Visit: Payer: Self-pay | Admitting: Obstetrics and Gynecology

## 2015-07-27 ENCOUNTER — Telehealth: Payer: Self-pay | Admitting: Obstetrics and Gynecology

## 2015-07-27 ENCOUNTER — Other Ambulatory Visit: Payer: Self-pay

## 2015-07-27 NOTE — Telephone Encounter (Signed)
Unable to reach pt. No answer and voicemail not set up.

## 2015-08-02 ENCOUNTER — Encounter: Payer: Self-pay | Admitting: Women's Health

## 2015-08-29 ENCOUNTER — Encounter: Payer: Self-pay | Admitting: Adult Health

## 2015-08-29 ENCOUNTER — Telehealth: Payer: Self-pay | Admitting: *Deleted

## 2015-08-29 ENCOUNTER — Ambulatory Visit (INDEPENDENT_AMBULATORY_CARE_PROVIDER_SITE_OTHER): Payer: Medicaid Other | Admitting: Adult Health

## 2015-08-29 VITALS — BP 140/80 | HR 74 | Ht 64.0 in | Wt 172.5 lb

## 2015-08-29 DIAGNOSIS — Z3201 Encounter for pregnancy test, result positive: Secondary | ICD-10-CM | POA: Diagnosis not present

## 2015-08-29 DIAGNOSIS — R58 Hemorrhage, not elsewhere classified: Secondary | ICD-10-CM | POA: Diagnosis not present

## 2015-08-29 DIAGNOSIS — O3680X0 Pregnancy with inconclusive fetal viability, not applicable or unspecified: Secondary | ICD-10-CM

## 2015-08-29 DIAGNOSIS — O209 Hemorrhage in early pregnancy, unspecified: Secondary | ICD-10-CM

## 2015-08-29 DIAGNOSIS — Z3490 Encounter for supervision of normal pregnancy, unspecified, unspecified trimester: Secondary | ICD-10-CM

## 2015-08-29 HISTORY — DX: Hemorrhage, not elsewhere classified: R58

## 2015-08-29 LAB — POCT URINE PREGNANCY: Preg Test, Ur: POSITIVE — AB

## 2015-08-29 MED ORDER — DOXYLAMINE-PYRIDOXINE 10-10 MG PO TBEC: DELAYED_RELEASE_TABLET | ORAL | Status: DC

## 2015-08-29 NOTE — Telephone Encounter (Signed)
Pt did not get labs, did not have money will get paid  Wednesday, can get labs then, she will call me back so order can be in, go apply for medicaid ASAAP

## 2015-08-29 NOTE — Progress Notes (Signed)
Subjective:     Patient ID: Adriana Perry, female   DOB: 1995-08-25, 20 y.o.   MRN: [id number]  HPI Adriana Perry is a 20 year old mixed female in for a UPT, had miscarriage in December QHCG was 40 12/5 in ER and had empty sac on US, has not had another period and had nausea and vomiting and has been smoking pot.She said she started bleeding when got to office today.   Review of Systems Patient denies any headaches, hearing loss, fatigue, blurred vision, shortness of breath, chest pain, abdominal pain, problems with bowel movements, urination, or intercourse. No joint pain or mood swings. See HPI for positives. Reviewed past medical,surgical, social and family history. Reviewed medications and allergies.     Objective:   Physical Exam BP 140/80 mmHg  Pulse 74  Ht 5\' 4"  (1.626 m)  Wt 172 lb 8 oz (78.245 kg)  BMI 29.59 kg/m2  LMP 07/26/2015  Breastfeeding? No +UPT, had miscarriage in December, so about 4 weeks, started bleeding this am, Skin warm and dry. Neck: mid line trachea, normal thyroid, good ROM, no lymphadenopathy noted. Lungs: clear to ausculation bilaterally. Cardiovascular: regular rate and rhythm.Abdomen soft and non tender, blood type O+.    Discussed could be miscarriage will get labs today and talk in am.  Assessment:     Pregnant Bleeding in early pregnancy    Plan:     Check QHCG and progesterone  Follow up in 2 weeks for dating US, depending on labs No sex, No THC Given 36 tabs of diclegis take as directed lot [id number] exp 03/18/17

## 2015-08-29 NOTE — Patient Instructions (Signed)
Take diclegis No THC No sex

## 2015-08-29 NOTE — Telephone Encounter (Signed)
Pt didn't have labs drawn this am at [facility] due to cost. Pt don't plan on having labs drawn. JAG aware. JSY

## 2015-09-02 LAB — BETA HCG QUANT (REF LAB): hCG Quant: 32816 m[IU]/mL

## 2015-09-02 LAB — PROGESTERONE: Progesterone: 14 ng/mL

## 2015-09-05 ENCOUNTER — Telehealth: Payer: Self-pay | Admitting: Adult Health

## 2015-09-05 NOTE — Telephone Encounter (Signed)
No voice mail.

## 2015-09-06 ENCOUNTER — Telehealth: Payer: Self-pay | Admitting: Adult Health

## 2015-09-06 NOTE — Telephone Encounter (Signed)
Will send letter about labs 

## 2015-09-06 NOTE — Telephone Encounter (Signed)
Pt called stating that she would like to know the results of her blood work. Please contact pt °

## 2015-09-06 NOTE — Telephone Encounter (Signed)
Voice mail not set up @ 1:47 pm. JSY

## 2015-09-07 ENCOUNTER — Telehealth: Payer: Self-pay | Admitting: Adult Health

## 2015-09-07 NOTE — Telephone Encounter (Signed)
Pt called stating that she would like a call back from Jennifer's nurse. Please contact pt

## 2015-09-07 NOTE — Telephone Encounter (Signed)
Spoke with pt letting her know her lab results and to keep her scheduled appt. Pt voiced understanding. JSY

## 2015-09-07 NOTE — Telephone Encounter (Signed)
Spoke with pt letting her know her lab results and that a letter had been mailed out with results. Pt states she has been approved for pregnancy medicaid. JSY

## 2015-09-07 NOTE — Telephone Encounter (Signed)
No voice mail @ 10:52 am. JSY

## 2015-09-08 ENCOUNTER — Other Ambulatory Visit: Payer: Self-pay | Admitting: Obstetrics and Gynecology

## 2015-09-08 DIAGNOSIS — O3680X0 Pregnancy with inconclusive fetal viability, not applicable or unspecified: Secondary | ICD-10-CM

## 2015-09-12 ENCOUNTER — Ambulatory Visit (INDEPENDENT_AMBULATORY_CARE_PROVIDER_SITE_OTHER): Payer: Medicaid Other

## 2015-09-12 DIAGNOSIS — O3680X0 Pregnancy with inconclusive fetal viability, not applicable or unspecified: Secondary | ICD-10-CM

## 2015-09-12 DIAGNOSIS — Z3A08 8 weeks gestation of pregnancy: Secondary | ICD-10-CM

## 2015-09-12 NOTE — Progress Notes (Signed)
[place] 8wks single IUP w/ys,pos fht 164 bpm,normal ov's bilat,crl 16.65mm

## 2015-09-21 ENCOUNTER — Encounter: Payer: Self-pay | Admitting: Adult Health

## 2015-09-21 ENCOUNTER — Ambulatory Visit (INDEPENDENT_AMBULATORY_CARE_PROVIDER_SITE_OTHER): Payer: Medicaid Other | Admitting: Adult Health

## 2015-09-21 VITALS — BP 132/80 | HR 76 | Wt 173.0 lb

## 2015-09-21 DIAGNOSIS — Z331 Pregnant state, incidental: Secondary | ICD-10-CM

## 2015-09-21 DIAGNOSIS — Z0283 Encounter for blood-alcohol and blood-drug test: Secondary | ICD-10-CM

## 2015-09-21 DIAGNOSIS — O99331 Smoking (tobacco) complicating pregnancy, first trimester: Secondary | ICD-10-CM | POA: Diagnosis not present

## 2015-09-21 DIAGNOSIS — Z3491 Encounter for supervision of normal pregnancy, unspecified, first trimester: Secondary | ICD-10-CM

## 2015-09-21 DIAGNOSIS — Z1389 Encounter for screening for other disorder: Secondary | ICD-10-CM

## 2015-09-21 DIAGNOSIS — Z3481 Encounter for supervision of other normal pregnancy, first trimester: Secondary | ICD-10-CM | POA: Diagnosis not present

## 2015-09-21 DIAGNOSIS — O210 Mild hyperemesis gravidarum: Secondary | ICD-10-CM | POA: Diagnosis not present

## 2015-09-21 DIAGNOSIS — Z3A10 10 weeks gestation of pregnancy: Secondary | ICD-10-CM

## 2015-09-21 DIAGNOSIS — Z369 Encounter for antenatal screening, unspecified: Secondary | ICD-10-CM

## 2015-09-21 DIAGNOSIS — R110 Nausea: Secondary | ICD-10-CM | POA: Insufficient documentation

## 2015-09-21 DIAGNOSIS — Z3490 Encounter for supervision of normal pregnancy, unspecified, unspecified trimester: Secondary | ICD-10-CM | POA: Insufficient documentation

## 2015-09-21 DIAGNOSIS — Z3682 Encounter for antenatal screening for nuchal translucency: Secondary | ICD-10-CM

## 2015-09-21 HISTORY — DX: Encounter for supervision of normal pregnancy, unspecified, first trimester: Z34.91

## 2015-09-21 HISTORY — DX: Nausea: R11.0

## 2015-09-21 LAB — POCT URINALYSIS DIPSTICK
Glucose, UA: NEGATIVE
KETONES UA: NEGATIVE
Nitrite, UA: NEGATIVE
PROTEIN UA: NEGATIVE

## 2015-09-21 MED ORDER — FLINTSTONES COMPLETE 60 MG PO CHEW: CHEWABLE_TABLET | ORAL | Status: DC

## 2015-09-21 MED ORDER — PROMETHAZINE HCL 25 MG PO TABS: 25.0000 mg | ORAL_TABLET | Freq: Four times a day (QID) | ORAL | Status: DC | PRN

## 2015-09-21 NOTE — Patient Instructions (Signed)

## 2015-09-21 NOTE — Progress Notes (Signed)
Subjective:  Adriana Perry is a 20 y.o. G2P1001 African American female at 14w2dby [place] seen today for her first obstetrical visit.  Her obstetrical history is significant for smoker.  Pregnancy history fully reviewed.  Patient reports nausea, says diclegis not helping and she is moody. Denies vb, cramping, uti s/s, abnormal/malodorous vag d/c, or vulvovaginal itching/irritation.  BP 132/80 mmHg  Pulse 76  Wt 173 lb (78.472 kg)  LMP 07/26/2015  HISTORY: OB History  Gravida Para Term Preterm AB SAB TAB Ectopic Multiple Living  2 1 1       0 1    # Outcome Date GA Lbr Len/2nd Weight Sex Delivery Anes PTL Lv  2 Current           1 Term 06/22/14 [id number] / 01:17 6 lb 2.6 oz (2.795 kg) M Vag-Spont EPI  Y     Comments: none     Past Medical History  Diagnosis Date  . Medical history non-contributory   . Bleeding 08/29/2015  . Supervision of normal pregnancy in first trimester 09/21/2015     [facility]   9+2 weeks FOB  Tai Fitzgerald 20 yo BM 2nd  Dating By   [place]  GC/CT Initial:                36+wks: Genetic Screen NT/IT:  CF screen  Anatomic [place] vaccine  Tdap Recommended ~ 28wks Glucose Screen  2 hr GBS  Feed Preference  Contraception  Circumcision  Childbirth Classes  Pediatrician     History reviewed. No pertinent past surgical history. Family History  Problem Relation Age of Onset  . Hypertension Paternal Grandfather   . Diabetes Paternal Grandfather   . Hypertension Paternal Grandmother   . Diabetes Paternal Grandmother   . Thyroid disease Paternal Grandmother   . Diabetes Maternal Grandfather   . Hypertension Maternal Grandfather     Exam   System:     General: Well developed & nourished, no acute distress   Skin: Warm & dry, normal coloration and turgor, no rashes   Neurologic: Alert & oriented, normal mood   Cardiovascular: Regular rate & rhythm   Respiratory: Effort & rate normal, LCTAB, acyanotic   Abdomen: Soft, non tender   Extremities: normal strength, tone   Pelvic Exam:    Perineum: deferred   Vulva: deferred   Vagina:  deferred   Cervix: deferred   Uterus: deferred   Thin prep pap smear pt refused today, will do next visit FHR: 175 via [place] Assessment:   Pregnancy: G2P1001 Patient Active Problem List   Diagnosis Date Noted  . Supervision of normal pregnancy in first trimester 09/21/2015  . Bleeding 08/29/2015  . Pregnant 07/18/2015  . Trichomonal vaginitis in pregnancy 05/10/2014  . Marijuana use 11/15/2013    [id number] New OB visit     Plan:  Initial labs drawn Continue prenatal vitamins Problem list reviewed and updated Reviewed n/v relief measures and warning s/s to report Reviewed recommended weight gain based on pre-gravid BMI Encouraged well-balanced diet Genetic Screening discussed Integrated Screen: requested Cystic fibrosis screening discussed declined Ultrasound discussed; fetal survey: requested Follow up in 3 weeks for for IT/NT and pap with me Rx phenergan 25 mg take 1 every 6 hours prn nausea with 1 refill Will get flu shot next visit, will take 2 flintstones, encouraged to stop smoking THC  JenEstill DoomsP 09/21/2015 10:42 AM

## 2015-09-22 LAB — URINE CULTURE

## 2015-09-23 LAB — GC/CHLAMYDIA PROBE AMP
Chlamydia trachomatis, NAA: NEGATIVE
NEISSERIA GONORRHOEAE BY PCR: NEGATIVE

## 2015-10-11 ENCOUNTER — Telehealth: Payer: Self-pay | Admitting: *Deleted

## 2015-10-11 NOTE — Telephone Encounter (Signed)
Voice mail not set up @ 12:18 pm. JSY

## 2015-10-11 NOTE — Telephone Encounter (Signed)
Voice mail not set up @ 2:44 pm. JSY

## 2015-10-12 ENCOUNTER — Other Ambulatory Visit: Payer: Medicaid Other

## 2015-10-12 ENCOUNTER — Encounter: Payer: Medicaid Other | Admitting: Adult Health

## 2015-10-12 NOTE — Telephone Encounter (Signed)
Voice mail not set up :31 am. JSY

## 2015-10-12 NOTE — Telephone Encounter (Signed)
Multiple tries to reach pt and her voice mail is not set up. Encounter closed. JSY

## 2015-10-16 ENCOUNTER — Ambulatory Visit (INDEPENDENT_AMBULATORY_CARE_PROVIDER_SITE_OTHER): Payer: Medicaid Other | Admitting: Women's Health

## 2015-10-16 ENCOUNTER — Other Ambulatory Visit: Payer: Self-pay | Admitting: Women's Health

## 2015-10-16 ENCOUNTER — Telehealth: Payer: Self-pay | Admitting: *Deleted

## 2015-10-16 ENCOUNTER — Other Ambulatory Visit (HOSPITAL_COMMUNITY)
Admission: RE | Admit: 2015-10-16 | Discharge: 2015-10-16 | Disposition: A | Discharge status: Home / Self Care | Payer: Medicaid Other | Source: Ambulatory Visit | Attending: Obstetrics & Gynecology | Admitting: Obstetrics & Gynecology

## 2015-10-16 ENCOUNTER — Ambulatory Visit (INDEPENDENT_AMBULATORY_CARE_PROVIDER_SITE_OTHER): Payer: Medicaid Other

## 2015-10-16 ENCOUNTER — Encounter: Payer: Self-pay | Admitting: Women's Health

## 2015-10-16 ENCOUNTER — Ambulatory Visit: Payer: Medicaid Other

## 2015-10-16 VITALS — BP 106/58 | HR 72 | Wt 170.0 lb

## 2015-10-16 DIAGNOSIS — Z3A13 13 weeks gestation of pregnancy: Secondary | ICD-10-CM | POA: Diagnosis not present

## 2015-10-16 DIAGNOSIS — Z3491 Encounter for supervision of normal pregnancy, unspecified, first trimester: Secondary | ICD-10-CM

## 2015-10-16 DIAGNOSIS — Z113 Encounter for screening for infections with a predominantly sexual mode of transmission: Secondary | ICD-10-CM | POA: Diagnosis present

## 2015-10-16 DIAGNOSIS — Z23 Encounter for immunization: Secondary | ICD-10-CM | POA: Diagnosis not present

## 2015-10-16 DIAGNOSIS — Z36 Encounter for antenatal screening of mother: Secondary | ICD-10-CM | POA: Diagnosis not present

## 2015-10-16 DIAGNOSIS — O23591 Infection of other part of genital tract in pregnancy, first trimester: Secondary | ICD-10-CM | POA: Diagnosis not present

## 2015-10-16 DIAGNOSIS — Z01419 Encounter for gynecological examination (general) (routine) without abnormal findings: Secondary | ICD-10-CM | POA: Insufficient documentation

## 2015-10-16 DIAGNOSIS — Z124 Encounter for screening for malignant neoplasm of cervix: Secondary | ICD-10-CM | POA: Diagnosis not present

## 2015-10-16 DIAGNOSIS — Z3682 Encounter for antenatal screening for nuchal translucency: Secondary | ICD-10-CM

## 2015-10-16 DIAGNOSIS — Z1389 Encounter for screening for other disorder: Secondary | ICD-10-CM | POA: Diagnosis not present

## 2015-10-16 DIAGNOSIS — Z331 Pregnant state, incidental: Secondary | ICD-10-CM | POA: Diagnosis not present

## 2015-10-16 DIAGNOSIS — Z3481 Encounter for supervision of other normal pregnancy, first trimester: Secondary | ICD-10-CM | POA: Diagnosis not present

## 2015-10-16 DIAGNOSIS — A5901 Trichomonal vulvovaginitis: Secondary | ICD-10-CM

## 2015-10-16 LAB — POCT WET PREP (WET MOUNT): Clue Cells Wet Prep Whiff POC: NEGATIVE

## 2015-10-16 LAB — POCT URINALYSIS DIPSTICK
Blood, UA: NEGATIVE
Glucose, UA: NEGATIVE
Ketones, UA: NEGATIVE
NITRITE UA: NEGATIVE
PROTEIN UA: NEGATIVE

## 2015-10-16 NOTE — Telephone Encounter (Signed)
vm not set up yet

## 2015-10-16 NOTE — Progress Notes (Unsigned)
[place] 12+6 wks,measurements c/w,crl 6.47,NB present,nt 1.3 mm,fhr 159 bpm

## 2015-10-16 NOTE — Progress Notes (Signed)
Low-risk OB appointment G2P1001 [redacted]w[redacted]d Estimated Date of Delivery: 04/23/16 BP 106/58 mmHg  Pulse 72  Wt 170 lb (77.111 kg)  LMP 07/26/2015  BP, weight, and urine reviewed.  Refer to obstetrical flow sheet for FH & FHR.  No fm yet. Denies cramping, lof, vb, or uti s/s. No complaints. Didn't go to lab to get pn1 last visit, uds also wasn't done although ordered. States she quit THC ~1wk ago. Didn't want pap last visit, will do today.  Thin prep pap obtained, h/o trich w/ no documented poc, so wet prep also done: mod wbc's, no trich noted Reviewed today's normal nt u/s, warning s/s to report. Plan:  Continue routine obstetrical care  F/U in 4wks for OB appointment and 2nd IT 1st IT/NT, flu shot, pn1 today

## 2015-10-16 NOTE — Patient Instructions (Signed)

## 2015-10-16 NOTE — Progress Notes (Signed)
[place] 12+6 wks,measurements c/w dates,normal ov's bilat,ant pl,fhr 159,ant pl,NB present,NT 1.3 mm

## 2015-10-17 ENCOUNTER — Other Ambulatory Visit: Payer: Medicaid Other

## 2015-10-17 LAB — CYTOLOGY - PAP

## 2015-11-13 ENCOUNTER — Encounter: Payer: Self-pay | Admitting: *Deleted

## 2015-11-13 ENCOUNTER — Encounter: Payer: Medicaid Other | Admitting: Obstetrics and Gynecology

## 2015-11-29 ENCOUNTER — Encounter: Payer: Self-pay | Admitting: *Deleted

## 2015-11-29 ENCOUNTER — Encounter: Payer: Medicaid Other | Admitting: Women's Health

## 2016-01-17 ENCOUNTER — Ambulatory Visit (INDEPENDENT_AMBULATORY_CARE_PROVIDER_SITE_OTHER): Payer: Medicaid Other | Admitting: Advanced Practice Midwife

## 2016-01-17 ENCOUNTER — Encounter: Payer: Self-pay | Admitting: Advanced Practice Midwife

## 2016-01-17 VITALS — BP 90/60 | HR 76 | Wt 179.0 lb

## 2016-01-17 DIAGNOSIS — Z1389 Encounter for screening for other disorder: Secondary | ICD-10-CM | POA: Diagnosis not present

## 2016-01-17 DIAGNOSIS — Z331 Pregnant state, incidental: Secondary | ICD-10-CM | POA: Diagnosis not present

## 2016-01-17 DIAGNOSIS — O0932 Supervision of pregnancy with insufficient antenatal care, second trimester: Secondary | ICD-10-CM | POA: Diagnosis not present

## 2016-01-17 DIAGNOSIS — Z3A26 26 weeks gestation of pregnancy: Secondary | ICD-10-CM | POA: Diagnosis not present

## 2016-01-17 DIAGNOSIS — Z363 Encounter for antenatal screening for malformations: Secondary | ICD-10-CM

## 2016-01-17 DIAGNOSIS — Z3482 Encounter for supervision of other normal pregnancy, second trimester: Secondary | ICD-10-CM

## 2016-01-17 DIAGNOSIS — Z3492 Encounter for supervision of normal pregnancy, unspecified, second trimester: Secondary | ICD-10-CM

## 2016-01-17 LAB — POCT URINALYSIS DIPSTICK
GLUCOSE UA: NEGATIVE
KETONES UA: NEGATIVE
Leukocytes, UA: NEGATIVE
Nitrite, UA: NEGATIVE
Protein, UA: NEGATIVE
RBC UA: NEGATIVE

## 2016-01-17 NOTE — Patient Instructions (Addendum)
1. Before your test, do not eat or drink anything for 8-10 hours prior to your  appointment (a small amount of water is allowed and you may take any medicines you normally take). Be sure to drink lots of water the day before. 2. When you arrive, your blood will be drawn for a 'fasting' blood sugar level.  Then you will be given a sweetened carbonated beverage to drink. You should  complete drinking this beverage within five minutes. After finishing the  beverage, you will have your blood drawn exactly 1 and 2 hours later. Having  your blood drawn on time is an important part of this test. A total of three blood  samples will be done. 3. The test takes approximately 2  hours. During the test, do not have anything to  eat or drink. Do not smoke, chew gum (not even sugarless gum) or use breath mints.  4. During the test you should remain close by and seated as much as possible and  avoid walking around. You may want to bring a book or something else to  occupy your time.  5. After your test, you may eat and drink as normal. You may want to bring a snack  to eat after the test is finished. Your provider will advise you as to the results of  this test and any follow-up if necessary  If your sugar test is positive for gestational diabetes, you will be given an phone call and further instructions discussed. If you wish to know all of your test results before your next appointment, feel free to call the office, or look up your test results on Mychart.  (The range that the lab uses for normal values of the sugar test are not necessarily the range that is used for pregnant women; if your results are within the normal range, they are definitely normal.  However, if a value is deemed "high" by the lab, it may not be too high for a pregnant woman.  We will need to discuss the results if your value(s) fall in the "high" category).     Tdap Vaccine  It is recommended that you get the Tdap vaccine during the  third trimester of EACH pregnancy to help protect your baby from getting pertussis (whooping cough)  27-36 weeks is the BEST time to do this so that you can pass the protection on to your baby. During pregnancy is better than after pregnancy, but if you are unable to get it during pregnancy it will be offered at the hospital.  You can get this vaccine at the health department or your family doctor, as well as some pharmacies.  Everyone who will be around your baby should also be up-to-date on their vaccines. Adults (who are not pregnant) only need 1 dose of Tdap during adulthood.     Third Trimester of Pregnancy The third trimester is from week 29 through week 42, months 7 through 9. The third trimester is a time when the fetus is growing rapidly. At the end of the ninth month, the fetus is about 20 inches in length and weighs 6-10 pounds.  BODY CHANGES Your body goes through many changes during pregnancy. The changes vary from woman to woman.   Your weight will continue to increase. You can expect to gain 25-35 pounds (11-16 kg) by the end of the pregnancy.  You may begin to get stretch marks on your hips, abdomen, and breasts.  You may urinate more often because  the fetus is moving lower into your pelvis and pressing on your bladder.  You may develop or continue to have heartburn as a result of your pregnancy.  You may develop constipation because certain hormones are causing the muscles that push waste through your intestines to slow down.  You may develop hemorrhoids or swollen, bulging veins (varicose veins).  You may have pelvic pain because of the weight gain and pregnancy hormones relaxing your joints between the bones in your pelvis. Backaches may result from overexertion of the muscles supporting your posture.  You may have changes in your hair. These can include thickening of your hair, rapid growth, and changes in texture. Some women also have hair loss during or after  pregnancy, or hair that feels dry or thin. Your hair will most likely return to normal after your baby is born.  Your breasts will continue to grow and be tender. A yellow discharge may leak from your breasts called colostrum.  Your belly button may stick out.  You may feel short of breath because of your expanding uterus.  You may notice the fetus "dropping," or moving lower in your abdomen.  You may have a bloody mucus discharge. This usually occurs a few days to a week before labor begins.  Your cervix becomes thin and soft (effaced) near your due date. WHAT TO EXPECT AT YOUR PRENATAL EXAMS  You will have prenatal exams every 2 weeks until week 36. Then, you will have weekly prenatal exams. During a routine prenatal visit:  You will be weighed to make sure you and the fetus are growing normally.  Your blood pressure is taken.  Your abdomen will be measured to track your baby's growth.  The fetal heartbeat will be listened to.  Any test results from the previous visit will be discussed.  You may have a cervical check near your due date to see if you have effaced. At around 36 weeks, your caregiver will check your cervix. At the same time, your caregiver will also perform a test on the secretions of the vaginal tissue. This test is to determine if a type of bacteria, Group B streptococcus, is present. Your caregiver will explain this further. Your caregiver may ask you:  What your birth plan is.  How you are feeling.  If you are feeling the baby move.  If you have had any abnormal symptoms, such as leaking fluid, bleeding, severe headaches, or abdominal cramping.  If you are using any tobacco products, including cigarettes, chewing tobacco, and electronic cigarettes.  If you have any questions. Other tests or screenings that may be performed during your third trimester include:  Blood tests that check for low iron levels (anemia).  Fetal testing to check the health,  activity level, and growth of the fetus. Testing is done if you have certain medical conditions or if there are problems during the pregnancy.  HIV (human immunodeficiency virus) testing. If you are at high risk, you may be screened for HIV during your third trimester of pregnancy. FALSE LABOR You may feel small, irregular contractions that eventually go away. These are called Braxton Hicks contractions, or false labor. Contractions may last for hours, days, or even weeks before true labor sets in. If contractions come at regular intervals, intensify, or become painful, it is best to be seen by your caregiver.  SIGNS OF LABOR   Menstrual-like cramps.  Contractions that are 5 minutes apart or less.  Contractions that start on the top of the  uterus and spread down to the lower abdomen and back.  A sense of increased pelvic pressure or back pain.  A watery or bloody mucus discharge that comes from the vagina. If you have any of these signs before the 37th week of pregnancy, call your caregiver right away. You need to go to the hospital to get checked immediately. HOME CARE INSTRUCTIONS   Avoid all smoking, herbs, alcohol, and unprescribed drugs. These chemicals affect the formation and growth of the baby.  Do not use any tobacco products, including cigarettes, chewing tobacco, and electronic cigarettes. If you need help quitting, ask your health care provider. You may receive counseling support and other resources to help you quit.  Follow your caregiver's instructions regarding medicine use. There are medicines that are either safe or unsafe to take during pregnancy.  Exercise only as directed by your caregiver. Experiencing uterine cramps is a good sign to stop exercising.  Continue to eat regular, healthy meals.  Wear a good support bra for breast tenderness.  Do not use hot tubs, steam rooms, or saunas.  Wear your seat belt at all times when driving.  Avoid raw meat, uncooked  cheese, cat litter boxes, and soil used by cats. These carry germs that can cause birth defects in the baby.  Take your prenatal vitamins.  Take 1500-2000 mg of calcium daily starting at the 20th week of pregnancy until you deliver your baby.  Try taking a stool softener (if your caregiver approves) if you develop constipation. Eat more high-fiber foods, such as fresh vegetables or fruit and whole grains. Drink plenty of fluids to keep your urine clear or pale yellow.  Take warm sitz baths to soothe any pain or discomfort caused by hemorrhoids. Use hemorrhoid cream if your caregiver approves.  If you develop varicose veins, wear support hose. Elevate your feet for 15 minutes, 3-4 times a day. Limit salt in your diet.  Avoid heavy lifting, wear low heal shoes, and practice good posture.  Rest a lot with your legs elevated if you have leg cramps or low back pain.  Visit your dentist if you have not gone during your pregnancy. Use a soft toothbrush to brush your teeth and be gentle when you floss.  A sexual relationship may be continued unless your caregiver directs you otherwise.  Do not travel far distances unless it is absolutely necessary and only with the approval of your caregiver.  Take prenatal classes to understand, practice, and ask questions about the labor and delivery.  Make a trial run to the hospital.  Pack your hospital bag.  Prepare the baby's nursery.  Continue to go to all your prenatal visits as directed by your caregiver. SEEK MEDICAL CARE IF:  You are unsure if you are in labor or if your water has broken.  You have dizziness.  You have mild pelvic cramps, pelvic pressure, or nagging pain in your abdominal area.  You have persistent nausea, vomiting, or diarrhea.  You have a bad smelling vaginal discharge.  You have pain with urination. SEEK IMMEDIATE MEDICAL CARE IF:   You have a fever.  You are leaking fluid from your vagina.  You have spotting  or bleeding from your vagina.  You have severe abdominal cramping or pain.  You have rapid weight loss or gain.  You have shortness of breath with chest pain.  You notice sudden or extreme swelling of your face, hands, ankles, feet, or legs.  You have not felt your baby move  in over an hour.  You have severe headaches that do not go away with medicine.  You have vision changes.   This information is not intended to replace advice given to you by your health care provider. Make sure you discuss any questions you have with your health care provider.   Document Released: 07/30/2001 Document Revised: 08/26/2014 Document Reviewed: 10/06/2012 Elsevier Interactive Patient Education 2016 [employer].   Levonorgestrel intrauterine device (IUD) What is this medicine? LEVONORGESTREL IUD (LEE voe nor jes trel) is a contraceptive (birth control) device. The device is placed inside the uterus by a healthcare professional. It is used to prevent pregnancy and can also be used to treat heavy bleeding that occurs during your period. Depending on the device, it can be used for 3 to 5 years. This medicine may be used for other purposes; ask your health care provider or pharmacist if you have questions. What should I tell my health care provider before I take this medicine? They need to know if you have any of these conditions: -abnormal Pap smear -cancer of the breast, uterus, or cervix -diabetes -endometritis -genital or pelvic infection now or in the past -have more than one sexual partner or your partner has more than one partner -heart disease -history of an ectopic or tubal pregnancy -immune system problems -IUD in place -liver disease or tumor -problems with blood clots or take blood-thinners -use intravenous drugs -uterus of unusual shape -vaginal bleeding that has not been explained -an unusual or allergic reaction to levonorgestrel, other hormones, silicone, or polyethylene,  medicines, foods, dyes, or preservatives -pregnant or trying to get pregnant -breast-feeding How should I use this medicine? This device is placed inside the uterus by a health care professional. Talk to your pediatrician regarding the use of this medicine in children. Special care may be needed. Overdosage: If you think you have taken too much of this medicine contact a poison control center or emergency room at once. NOTE: This medicine is only for you. Do not share this medicine with others. What if I miss a dose? This does not apply. What may interact with this medicine? Do not take this medicine with any of the following medications: -amprenavir -bosentan -fosamprenavir This medicine may also interact with the following medications: -aprepitant -barbiturate medicines for inducing sleep or treating seizures -bexarotene -griseofulvin -medicines to treat seizures like carbamazepine, ethotoin, felbamate, oxcarbazepine, phenytoin, topiramate -modafinil -pioglitazone -rifabutin -rifampin -rifapentine -some medicines to treat HIV infection like atazanavir, indinavir, lopinavir, nelfinavir, tipranavir, ritonavir -St. John's wort -warfarin This list may not describe all possible interactions. Give your health care provider a list of all the medicines, herbs, non-prescription drugs, or dietary supplements you use. Also tell them if you smoke, drink alcohol, or use illegal drugs. Some items may interact with your medicine. What should I watch for while using this medicine? Visit your doctor or health care professional for regular check ups. See your doctor if you or your partner has sexual contact with others, becomes HIV positive, or gets a sexual transmitted disease. This product does not protect you against HIV infection (AIDS) or other sexually transmitted diseases. You can check the placement of the IUD yourself by reaching up to the top of your vagina with clean fingers to feel the  threads. Do not pull on the threads. It is a good habit to check placement after each menstrual period. Call your doctor right away if you feel more of the IUD than just the threads or if you  cannot feel the threads at all. The IUD may come out by itself. You may become pregnant if the device comes out. If you notice that the IUD has come out use a backup birth control method like condoms and call your health care provider. Using tampons will not change the position of the IUD and are okay to use during your period. What side effects may I notice from receiving this medicine? Side effects that you should report to your doctor or health care professional as soon as possible: -allergic reactions like skin rash, itching or hives, swelling of the face, lips, or tongue -fever, flu-like symptoms -genital sores -high blood pressure -no menstrual period for 6 weeks during use -pain, swelling, warmth in the leg -pelvic pain or tenderness -severe or sudden headache -signs of pregnancy -stomach cramping -sudden shortness of breath -trouble with balance, talking, or walking -unusual vaginal bleeding, discharge -yellowing of the eyes or skin Side effects that usually do not require medical attention (report to your doctor or health care professional if they continue or are bothersome): -acne -breast pain -change in sex drive or performance -changes in weight -cramping, dizziness, or faintness while the device is being inserted -headache -irregular menstrual bleeding within first 3 to 6 months of use -nausea This list may not describe all possible side effects. Call your doctor for medical advice about side effects. You may report side effects to FDA at 1-800-FDA-1088. Where should I keep my medicine? This does not apply. NOTE: This sheet is a summary. It may not cover all possible information. If you have questions about this medicine, talk to your doctor, pharmacist, or health care provider.     2016, Elsevier/Gold Standard. (2011-09-05 13:54:04)

## 2016-01-17 NOTE — Progress Notes (Signed)
G2P1001 3454w1d Estimated Date of Delivery: 04/23/16  Blood pressure 90/60, pulse 76, weight 179 lb (81.194 kg), last menstrual period 07/26/2015, unknown if currently breastfeeding.   BP weight and urine results all reviewed and noted.  Please refer to the obstetrical flow sheet for the fundal height and fetal heart rate documentation:  Patient reports good fetal movement, denies any bleeding and no rupture of membranes symptoms or regular contractions. Patient is without complaints. All questions were answered.  Orders Placed This Encounter  Procedures  . US OB Comp + 14 Wk  . POCT urinalysis dipstick    Plan:  Continued routine obstetrical care,   Return in about 1 week (around 01/24/2016) for ZO:XWRUEAVS:Anatomy, PN2/LROB.

## 2016-01-24 ENCOUNTER — Ambulatory Visit (INDEPENDENT_AMBULATORY_CARE_PROVIDER_SITE_OTHER): Payer: Medicaid Other | Admitting: Obstetrics and Gynecology

## 2016-01-24 ENCOUNTER — Other Ambulatory Visit: Payer: Medicaid Other

## 2016-01-24 ENCOUNTER — Ambulatory Visit (INDEPENDENT_AMBULATORY_CARE_PROVIDER_SITE_OTHER): Payer: Medicaid Other

## 2016-01-24 ENCOUNTER — Encounter: Payer: Self-pay | Admitting: Obstetrics and Gynecology

## 2016-01-24 VITALS — BP 120/78 | HR 66 | Wt 179.0 lb

## 2016-01-24 DIAGNOSIS — Z1389 Encounter for screening for other disorder: Secondary | ICD-10-CM | POA: Diagnosis not present

## 2016-01-24 DIAGNOSIS — Z363 Encounter for antenatal screening for malformations: Secondary | ICD-10-CM

## 2016-01-24 DIAGNOSIS — O321XX1 Maternal care for breech presentation, fetus 1: Secondary | ICD-10-CM | POA: Diagnosis not present

## 2016-01-24 DIAGNOSIS — Z369 Encounter for antenatal screening, unspecified: Secondary | ICD-10-CM

## 2016-01-24 DIAGNOSIS — Z36 Encounter for antenatal screening of mother: Secondary | ICD-10-CM | POA: Diagnosis not present

## 2016-01-24 DIAGNOSIS — O0932 Supervision of pregnancy with insufficient antenatal care, second trimester: Secondary | ICD-10-CM

## 2016-01-24 DIAGNOSIS — Z3A28 28 weeks gestation of pregnancy: Secondary | ICD-10-CM | POA: Diagnosis not present

## 2016-01-24 DIAGNOSIS — Z3492 Encounter for supervision of normal pregnancy, unspecified, second trimester: Secondary | ICD-10-CM

## 2016-01-24 DIAGNOSIS — O0933 Supervision of pregnancy with insufficient antenatal care, third trimester: Secondary | ICD-10-CM

## 2016-01-24 DIAGNOSIS — Z3483 Encounter for supervision of other normal pregnancy, third trimester: Secondary | ICD-10-CM

## 2016-01-24 DIAGNOSIS — Z331 Pregnant state, incidental: Secondary | ICD-10-CM | POA: Diagnosis not present

## 2016-01-24 DIAGNOSIS — Z131 Encounter for screening for diabetes mellitus: Secondary | ICD-10-CM

## 2016-01-24 LAB — POCT URINALYSIS DIPSTICK
GLUCOSE UA: NEGATIVE
Ketones, UA: NEGATIVE
Leukocytes, UA: NEGATIVE
NITRITE UA: NEGATIVE
RBC UA: NEGATIVE

## 2016-01-24 NOTE — Progress Notes (Signed)
US 27+1 wks,breech,cx 3.3 cm,normal ov's bilat,ant pl gr 1,LVEICF 1.9 mm,EFW 1011 g 48%,svp of fluid 4.4cm,fhr 151 bpm,anatomy complete, no obvious abnormalities seen,measurements c/w dates

## 2016-01-24 NOTE — Progress Notes (Signed)
Pt denies any problems or concerns at this time.  

## 2016-01-24 NOTE — Progress Notes (Signed)
Patient ID: Adriana Perry, female   DOB: [birth year], 20 y.o.   MRN: [id number]  G2P1001 7123w1d Estimated Date of Delivery: 04/23/16  Blood pressure 120/78, pulse 66, weight 179 lb (81.194 kg), last menstrual period 07/26/2015, unknown if currently breastfeeding.   refer to the ob flow sheet for FH and FHR, also BP, Wt, Urine results: notable for trace protein  Patient reports good fetal movement, denies any bleeding and no rupture of membranes symptoms or regular contractions. Pt plans to breastfeed. She states she plans to have a Nexplanon implant following this pregnancy. Patient complaints: none.  FHR: 155 BPM FH: 28 cm  Questions were answered.  Assessment: LROB G2P1001 @ 2423w1d   Discussed LARC birth control options    Plan:  Continued routine obstetrical care  F/u in 4 weeks for regular pnx care   By signing my name below, I, Ronney LionSuzanne Le, attest that this documentation has been prepared under the direction and in the presence of Tilda BurrowJohn Destin Kittler V, MD. Electronically Signed: Ronney LionSuzanne Le, ED Scribe. 01/24/2016. 11:55 AM.   I personally performed the services described in this documentation, which was SCRIBED in my presence. The recorded information has been reviewed and considered accurate. It has been edited as necessary during review. Tilda BurrowFERGUSON,Joni Colegrove V, MD

## 2016-01-25 LAB — CBC
Hematocrit: 33.6 % — ABNORMAL LOW (ref 34.0–46.6)
Hemoglobin: 11.3 g/dL (ref 11.1–15.9)
MCH: 32.5 pg (ref 26.6–33.0)
MCHC: 33.6 g/dL (ref 31.5–35.7)
MCV: 97 fL (ref 79–97)
PLATELETS: 300 10*3/uL (ref 150–379)
RBC: 3.48 x10E6/uL — AB (ref 3.77–5.28)
RDW: 13.7 % (ref 12.3–15.4)
WBC: 9.7 10*3/uL (ref 3.4–10.8)

## 2016-01-25 LAB — ANTIBODY SCREEN: Antibody Screen: NEGATIVE

## 2016-01-25 LAB — GLUCOSE TOLERANCE, 2 HOURS W/ 1HR
GLUCOSE, 1 HOUR: 80 mg/dL (ref 65–179)
GLUCOSE, FASTING: 68 mg/dL (ref 65–91)
Glucose, 2 hour: 84 mg/dL (ref 65–152)

## 2016-01-25 LAB — HIV ANTIBODY (ROUTINE TESTING W REFLEX): HIV Screen 4th Generation wRfx: NONREACTIVE

## 2016-01-25 LAB — RPR: RPR: NONREACTIVE

## 2016-02-21 ENCOUNTER — Encounter: Payer: Self-pay | Admitting: Obstetrics and Gynecology

## 2016-02-21 ENCOUNTER — Ambulatory Visit (INDEPENDENT_AMBULATORY_CARE_PROVIDER_SITE_OTHER): Payer: Medicaid Other | Admitting: Obstetrics and Gynecology

## 2016-02-21 VITALS — BP 120/76 | HR 91 | Wt 184.0 lb

## 2016-02-21 DIAGNOSIS — Z3A32 32 weeks gestation of pregnancy: Secondary | ICD-10-CM | POA: Diagnosis not present

## 2016-02-21 DIAGNOSIS — Z331 Pregnant state, incidental: Secondary | ICD-10-CM | POA: Diagnosis not present

## 2016-02-21 DIAGNOSIS — Z3492 Encounter for supervision of normal pregnancy, unspecified, second trimester: Secondary | ICD-10-CM

## 2016-02-21 DIAGNOSIS — Z3482 Encounter for supervision of other normal pregnancy, second trimester: Secondary | ICD-10-CM | POA: Diagnosis not present

## 2016-02-21 DIAGNOSIS — Z1389 Encounter for screening for other disorder: Secondary | ICD-10-CM

## 2016-02-21 LAB — POCT URINALYSIS DIPSTICK
Glucose, UA: NEGATIVE
KETONES UA: NEGATIVE
LEUKOCYTES UA: NEGATIVE
Nitrite, UA: NEGATIVE
PROTEIN UA: NEGATIVE
RBC UA: NEGATIVE

## 2016-02-21 NOTE — Progress Notes (Signed)
Pt states that every time the baby moves she has pressure in her lower pelvic area.

## 2016-02-21 NOTE — Progress Notes (Signed)
G2P1001 4557w1d Estimated Date of Delivery: 04/23/16 LROB  Patient reports   good fetal movement, denies any bleeding and no rupture of membranes symptoms or regular contractions. Patient complaints:no.  Blood pressure 120/76, pulse 91, weight 184 lb (83.462 kg), last menstrual period 07/26/2015, unknown if currently breastfeeding.  refer to the ob flow sheet for FH and FHR, also BP, Wt, Urine results:notable for neg x prot gluc                          Physical Examination: General appearance - alert, well appearing, and in no distress                                      Abdomen - FH 31 ,                                                         -FHR 145                                                                                                                                     Questions were answered. Assessment: LROB G2P1001 @ 4557w1d  LRob  Plan:  Continued routine obstetrical care, 2wk  F/u in 2 weeks for lrob Discussed contraception and will probably go Nexplanon after lengthy discussion of other techniques and Nexplanon

## 2016-02-28 ENCOUNTER — Inpatient Hospital Stay (HOSPITAL_COMMUNITY)
Admission: AD | Admit: 2016-02-28 | Discharge: 2016-02-28 | Disposition: A | Discharge status: Home / Self Care | Payer: Medicaid Other | Source: Ambulatory Visit | Attending: Obstetrics & Gynecology | Admitting: Obstetrics & Gynecology

## 2016-02-28 ENCOUNTER — Encounter (HOSPITAL_COMMUNITY): Payer: Self-pay

## 2016-02-28 ENCOUNTER — Telehealth: Payer: Self-pay | Admitting: *Deleted

## 2016-02-28 DIAGNOSIS — O26893 Other specified pregnancy related conditions, third trimester: Secondary | ICD-10-CM | POA: Insufficient documentation

## 2016-02-28 DIAGNOSIS — R109 Unspecified abdominal pain: Secondary | ICD-10-CM

## 2016-02-28 DIAGNOSIS — Z3A32 32 weeks gestation of pregnancy: Secondary | ICD-10-CM | POA: Insufficient documentation

## 2016-02-28 DIAGNOSIS — O26899 Other specified pregnancy related conditions, unspecified trimester: Secondary | ICD-10-CM

## 2016-02-28 DIAGNOSIS — O99333 Smoking (tobacco) complicating pregnancy, third trimester: Secondary | ICD-10-CM | POA: Diagnosis not present

## 2016-02-28 DIAGNOSIS — O9989 Other specified diseases and conditions complicating pregnancy, childbirth and the puerperium: Secondary | ICD-10-CM | POA: Diagnosis not present

## 2016-02-28 DIAGNOSIS — F17210 Nicotine dependence, cigarettes, uncomplicated: Secondary | ICD-10-CM | POA: Diagnosis not present

## 2016-02-28 LAB — WET PREP, GENITAL
CLUE CELLS WET PREP: NONE SEEN
Sperm: NONE SEEN
TRICH WET PREP: NONE SEEN
Yeast Wet Prep HPF POC: NONE SEEN

## 2016-02-28 NOTE — Discharge Instructions (Signed)

## 2016-02-28 NOTE — Telephone Encounter (Signed)
Pt Mom called and state pt having lower back and side pain, pt in tears, pain is exactly like when she went into labor with 1 st pregnancy. [facility]. Pt Mom verbalized understanding.

## 2016-02-28 NOTE — MAU Note (Signed)
Urine in lab 

## 2016-02-28 NOTE — MAU Provider Note (Signed)
Chief Complaint:  Abdominal Pain   First Provider Initiated Contact with Patient 02/28/16 1754      HPI: Adriana Perry is a 20 y.o. G2P1001 at 49w1dwho presents to maternity admissions reporting sharp abdominal pain that began this morning. Mild recurrent pain began 1 week ago. Today has sharp pain 6/10 occuring at 3-5 min intervals.This pain is similar to contractions during labor with her previous pregnancy. She reports good fetal movement, denies LOF and vaginal bleeding. She c/o HA today.   HPI  Past Medical History: Past Medical History  Diagnosis Date  . Medical history non-contributory   . Bleeding 08/29/2015  . Supervision of normal pregnancy in first trimester 09/21/2015     [place] [place]   9+2 weeks FOB  Tai Fitzgerald 20 yo BM 2nd  Dating By   [place]  GC/CT Initial:                36+wks: Genetic Screen NT/IT:  CF screen  Anatomic [place] Flu vaccine  Tdap Recommended ~ 28wks Glucose Screen  2 hr GBS  Feed Preference  Contraception  Circumcision  Childbirth Classes  Pediatrician    . Nausea 09/21/2015    Past obstetric history: OB History  Gravida Para Term Preterm AB SAB TAB Ectopic Multiple Living  2 1 1       0 1    # Outcome Date GA Lbr Len/2nd Weight Sex Delivery Anes PTL Lv  2 Current           1 Term 06/22/14 337w5d8:18 / 01:17 6 lb 2.6 oz (2.795 kg) M Vag-Spont EPI  Y     Comments: none      Past Surgical History: History reviewed. No pertinent past surgical history.  Family History: Family History  Problem Relation Age of Onset  . Hypertension Paternal Grandfather   . Diabetes Paternal Grandfather   . Hypertension Paternal Grandmother   . Diabetes Paternal Grandmother   . Thyroid disease Paternal Grandmother   . Diabetes Maternal Grandfather   . Hypertension Maternal Grandfather     Social History: Social History  Substance Use Topics  . Smoking status: Current Every Day Smoker -- 0.00 packs/day for 1 years  . Smokeless tobacco:  Never Used     Comment: smokes 2-3 cig daily  . Alcohol Use: No    Allergies: No Known Allergies   Meds:  Prescriptions prior to admission  Medication Sig Dispense Refill Last Dose  . flintstones complete (FLINTSTONES) 60 MG chewable tablet Take 2 daily (Patient taking differently: Chew 2 tablets by mouth daily. Take 2 daily) 30 tablet prn Past Week at Unknown time    ROS:  Review of Systems  Eyes: Negative for visual disturbance.  Respiratory: Negative for shortness of breath.   Gastrointestinal: Positive for abdominal pain.  Genitourinary: Negative for vaginal bleeding and vaginal discharge.  Neurological: Positive for headaches.     I have reviewed patient's Past Medical Hx, Surgical Hx, Family Hx, Social Hx, medications and allergies.   Physical Exam  Patient Vitals for the past 24 hrs:  BP Temp Temp src Pulse Resp  02/28/16 1641 111/86 mmHg 98 F (36.7 C) Oral 82 18   Constitutional: Well-developed, well-nourished female in no acute distress.  Cardiovascular: normal rate Respiratory: normal effort  Neurologic: Alert and oriented x 4.   Cervical exam:  Dilation: 1 Effacement (%): 50 Station: -3 Presentation: Vertex Exam by:: L Leftwich-Kirby CNM  FHT:  Baseline 135 ,  moderate variability, accelerations present, no decelerations Contractions: occasional, mild to palpation   Labs: Results for orders placed or performed during the hospital encounter of 02/28/16 (from the past 24 hour(s))  Wet prep, genital     Status: Abnormal   Collection Time: 02/28/16  6:32 PM  Result Value Ref Range   Yeast Wet Prep HPF POC NONE SEEN NONE SEEN   Trich, Wet Prep NONE SEEN NONE SEEN   Clue Cells Wet Prep HPF POC NONE SEEN NONE SEEN   WBC, Wet Prep HPF POC MANY (A) NONE SEEN   Sperm NONE SEEN       MAU Course/MDM: I have ordered labs and reviewed results.   Pt cervix 1 cm but pt reports that while in MAU her pain completely resolved and she wants to leave MAU and get  something to eat. Wet prep negative. Consult Dr Elonda Husky. Pt to f/u at [facility] as scheduled or sooner if symptoms persist.  Pt stable at time of discharge.  Assessment: 1. Abdominal pain affecting pregnancy, antepartum     Plan: Discharge home with preterm labor precautions  Follow-up Information    Follow up with [facility].   Why:  As scheduled, Return to MAU as needed for emergencies   Contact information:   [place] [id number] [phone]       Medication List    TAKE these medications        flintstones complete 60 MG chewable tablet  Take 2 daily        Fatima Blank Certified Nurse-Midwife 02/28/2016 7:05 PM

## 2016-02-28 NOTE — MAU Note (Signed)
Pt c/o abdominal pain that began around 1300 today. Pt reports it happens every 5-8 minutes. Denies bleeding or leaking of fluid. Does have clear/white mucous.

## 2016-02-29 ENCOUNTER — Telehealth: Payer: Self-pay | Admitting: Obstetrics & Gynecology

## 2016-02-29 NOTE — Telephone Encounter (Signed)
Pt called stating that she went to the hospital yesterday because she was having pain and they told her in the hospital the she is 2 cent. Pt would like a letter releasing her from work. Please contact pt

## 2016-03-05 NOTE — Telephone Encounter (Signed)
Unable to reach patient x 3 attempts

## 2016-03-06 ENCOUNTER — Encounter: Payer: Medicaid Other | Admitting: Women's Health

## 2016-04-17 ENCOUNTER — Encounter: Payer: Self-pay | Admitting: Obstetrics & Gynecology

## 2016-04-17 ENCOUNTER — Ambulatory Visit (INDEPENDENT_AMBULATORY_CARE_PROVIDER_SITE_OTHER): Payer: Medicaid Other | Admitting: Obstetrics & Gynecology

## 2016-04-17 VITALS — BP 120/80 | HR 78 | Wt 192.0 lb

## 2016-04-17 DIAGNOSIS — Z3A40 40 weeks gestation of pregnancy: Secondary | ICD-10-CM

## 2016-04-17 DIAGNOSIS — Z3493 Encounter for supervision of normal pregnancy, unspecified, third trimester: Secondary | ICD-10-CM

## 2016-04-17 DIAGNOSIS — Z3483 Encounter for supervision of other normal pregnancy, third trimester: Secondary | ICD-10-CM

## 2016-04-17 DIAGNOSIS — Z331 Pregnant state, incidental: Secondary | ICD-10-CM

## 2016-04-17 DIAGNOSIS — Z1389 Encounter for screening for other disorder: Secondary | ICD-10-CM | POA: Diagnosis not present

## 2016-04-17 LAB — POCT URINALYSIS DIPSTICK
GLUCOSE UA: NEGATIVE
KETONES UA: NEGATIVE
Leukocytes, UA: NEGATIVE
Nitrite, UA: NEGATIVE
PROTEIN UA: NEGATIVE
RBC UA: NEGATIVE

## 2016-04-17 NOTE — Progress Notes (Signed)
G2P1001 2734w1d Estimated Date of Delivery: 04/23/16  Blood pressure 120/80, pulse 78, weight 192 lb (87.1 kg), last menstrual period 07/26/2015, unknown if currently breastfeeding.   BP weight and urine results all reviewed and noted.  Please refer to the obstetrical flow sheet for the fundal height and fetal heart rate documentation:  Patient reports good fetal movement, denies any bleeding and no rupture of membranes symptoms or regular contractions. Patient is without complaints. All questions were answered.  Orders Placed This Encounter  Procedures  . POCT urinalysis dipstick    Plan:  Continued routine obstetrical care,   Return in about 1 week (around 04/24/2016) for LROB.

## 2016-04-24 ENCOUNTER — Encounter: Payer: Self-pay | Admitting: Obstetrics and Gynecology

## 2016-04-24 ENCOUNTER — Encounter: Payer: Medicaid Other | Admitting: Obstetrics and Gynecology

## 2016-05-01 NOTE — Progress Notes (Signed)
Pt was no show, not seen by provider on 7/19/17This encounter was created in error - please disregard.

## 2016-05-27 ENCOUNTER — Encounter: Payer: Self-pay | Admitting: Women's Health

## 2016-05-27 ENCOUNTER — Ambulatory Visit (INDEPENDENT_AMBULATORY_CARE_PROVIDER_SITE_OTHER): Payer: Medicaid Other | Admitting: Women's Health

## 2016-05-27 MED ORDER — ETONOGESTREL 68 MG ~~LOC~~ IMPL: 68.0000 mg | DRUG_IMPLANT | Freq: Once | SUBCUTANEOUS | Status: DC

## 2016-05-27 NOTE — Progress Notes (Signed)
Subjective:    Adriana Perry is a 20 y.o. 672P2002 Caucasian female who presents for a postpartum visit. She is 4 weeks postpartum following [facility]'t feel she could [facility]. Reports she was 9cm on arrival. Anesthesia: epidural. I have fully reviewed the prenatal and intrapartum course. Postpartum course has been uncomplicated. Reports back pain from epidural- requesting pain meds. Baby's course has been uncomplicated. Baby is feeding by bottle. Bleeding no bleeding. Bowel function is normal. Bladder function is normal. Patient is sexually active. Last sexual activity: 05/22/16. Contraception method is none and wants nexplanon but is having difficulty w/ insurance. Postpartum depression screening: negative. Score 1.  Last pap 09/2015 and was neg. LMP 05/13/16  The following portions of the patient's history were reviewed and updated as appropriate: allergies, current medications, past medical history, past surgical history and problem list.  Review of Systems Pertinent items are noted in HPI.   Vitals:   05/27/16 1110  BP: 122/90  Weight: 182 lb (82.6 kg)  Repeat bp 112/70  Patient's last menstrual period was 05/13/2016.  Objective:   General:  alert, cooperative and no distress   Breasts:  deferred, no complaints  Lungs: clear to auscultation bilaterally  Heart:  regular rate and rhythm  Abdomen: soft, nontender   Vulva: normal  Vagina: normal vagina  Cervix:  closed  Corpus: Well-involuted  Adnexa:  Non-palpable  Rectal Exam: No hemorrhoids        Assessment:   Postpartum [facility] Back pain from epidural Recent unprotected sex Bottlefeeding Depression screening Contraception counseling   Plan:  Discussed insurance w/ Amy, showing she has pharmacy benefits under mom's plan- try to send rx for nexplanon to her pharmacy and see if we can get it that way Rx nexplanon to [place]  [facility] If unable to fill, pt to call us and let us know, wants depo if unable to get nexplanon right now Contraception: abstinence until gets depo or nexplanon Follow up on 9/16 (10d from last sex on Sat) for bhcg am and depo or nexplanon pm Can try apap/ibuprofen, warm baths, heating pad for back pain  Marge DuncansBooker, Kimberly Randall CNM, [facility]NP-BC 05/27/2016 11:35 AM

## 2016-05-27 NOTE — Patient Instructions (Addendum)
NO SEX UNTIL AFTER YOU GET YOUR BIRTH CONTROL   Call me if they can't dispense Nexplanon and you need depo   Medroxyprogesterone injection [Contraceptive] What is this medicine? MEDROXYPROGESTERONE (me DROX ee proe JES te rone) contraceptive injections prevent pregnancy. They provide effective birth control for 3 months. Depo-subQ Provera 104 is also used for treating pain related to endometriosis. This medicine may be used for other purposes; ask your health care provider or pharmacist if you have questions. What should I tell my health care provider before I take this medicine? They need to know if you have any of these conditions: -frequently drink alcohol -asthma -blood vessel disease or a history of a blood clot in the lungs or legs -bone disease such as osteoporosis -breast cancer -diabetes -eating disorder (anorexia nervosa or bulimia) -high blood pressure -HIV infection or AIDS -kidney disease -liver disease -mental depression -migraine -seizures (convulsions) -stroke -tobacco smoker -vaginal bleeding -an unusual or allergic reaction to medroxyprogesterone, other hormones, medicines, foods, dyes, or preservatives -pregnant or trying to get pregnant -breast-feeding How should I use this medicine? Depo-Provera Contraceptive injection is given into a muscle. Depo-subQ Provera 104 injection is given under the skin. These injections are given by a health care professional. You must not be pregnant before getting an injection. The injection is usually given during the first 5 days after the start of a menstrual period or 6 weeks after delivery of a baby. Talk to your pediatrician regarding the use of this medicine in children. Special care may be needed. These injections have been used in female children who have started having menstrual periods. Overdosage: If you think you have taken too much of this medicine contact a poison control center or emergency room at once. NOTE: This  medicine is only for you. Do not share this medicine with others. What if I miss a dose? Try not to miss a dose. You must get an injection once every 3 months to maintain birth control. If you cannot keep an appointment, call and reschedule it. If you wait longer than 13 weeks between Depo-Provera contraceptive injections or longer than 14 weeks between Depo-subQ Provera 104 injections, you could get pregnant. Use another method for birth control if you miss your appointment. You may also need a pregnancy test before receiving another injection. What may interact with this medicine? Do not take this medicine with any of the following medications: -bosentan This medicine may also interact with the following medications: -aminoglutethimide -antibiotics or medicines for infections, especially rifampin, rifabutin, rifapentine, and griseofulvin -aprepitant -barbiturate medicines such as phenobarbital or primidone -bexarotene -carbamazepine -medicines for seizures like ethotoin, felbamate, oxcarbazepine, phenytoin, topiramate -modafinil -St. John's wort This list may not describe all possible interactions. Give your health care provider a list of all the medicines, herbs, non-prescription drugs, or dietary supplements you use. Also tell them if you smoke, drink alcohol, or use illegal drugs. Some items may interact with your medicine. What should I watch for while using this medicine? This drug does not protect you against HIV infection (AIDS) or other sexually transmitted diseases. Use of this product may cause you to lose calcium from your bones. Loss of calcium may cause weak bones (osteoporosis). Only use this product for more than 2 years if other forms of birth control are not right for you. The longer you use this product for birth control the more likely you will be at risk for weak bones. Ask your health care professional how you can keep strong  bones. You may have a change in bleeding pattern  or irregular periods. Many females stop having periods while taking this drug. If you have received your injections on time, your chance of being pregnant is very low. If you think you may be pregnant, see your health care professional as soon as possible. Tell your health care professional if you want to get pregnant within the next year. The effect of this medicine may last a long time after you get your last injection. What side effects may I notice from receiving this medicine? Side effects that you should report to your doctor or health care professional as soon as possible: -allergic reactions like skin rash, itching or hives, swelling of the face, lips, or tongue -breast tenderness or discharge -breathing problems -changes in vision -depression -feeling faint or lightheaded, falls -fever -pain in the abdomen, chest, groin, or leg -problems with balance, talking, walking -unusually weak or tired -yellowing of the eyes or skin Side effects that usually do not require medical attention (report to your doctor or health care professional if they continue or are bothersome): -acne -fluid retention and swelling -headache -irregular periods, spotting, or absent periods -temporary pain, itching, or skin reaction at site where injected -weight gain This list may not describe all possible side effects. Call your doctor for medical advice about side effects. You may report side effects to FDA at 1-800-FDA-1088. Where should I keep my medicine? This does not apply. The injection will be given to you by a health care professional. NOTE: This sheet is a summary. It may not cover all possible information. If you have questions about this medicine, talk to your doctor, pharmacist, or health care provider.    2016, Elsevier/Gold Standard. (2008-08-26 18:37:56)  Etonogestrel implant What is this medicine? ETONOGESTREL (et oh noe JES trel) is a contraceptive (birth control) device. It is used to  prevent pregnancy. It can be used for up to 3 years. This medicine may be used for other purposes; ask your health care provider or pharmacist if you have questions. What should I tell my health care provider before I take this medicine? They need to know if you have any of these conditions: -abnormal vaginal bleeding -blood vessel disease or blood clots -cancer of the breast, cervix, or liver -depression -diabetes -gallbladder disease -headaches -heart disease or recent heart attack -high blood pressure -high cholesterol -kidney disease -liver disease -renal disease -seizures -tobacco smoker -an unusual or allergic reaction to etonogestrel, other hormones, anesthetics or antiseptics, medicines, foods, dyes, or preservatives -pregnant or trying to get pregnant -breast-feeding How should I use this medicine? This device is inserted just under the skin on the inner side of your upper arm by a health care professional. Talk to your pediatrician regarding the use of this medicine in children. Special care may be needed. Overdosage: If you think you have taken too much of this medicine contact a poison control center or emergency room at once. NOTE: This medicine is only for you. Do not share this medicine with others. What if I miss a dose? This does not apply. What may interact with this medicine? Do not take this medicine with any of the following medications: -amprenavir -bosentan -fosamprenavir This medicine may also interact with the following medications: -barbiturate medicines for inducing sleep or treating seizures -certain medicines for fungal infections like ketoconazole and itraconazole -griseofulvin -medicines to treat seizures like carbamazepine, felbamate, oxcarbazepine, phenytoin, topiramate -modafinil -phenylbutazone -rifampin -some medicines to treat HIV infection like atazanavir,  indinavir, lopinavir, nelfinavir, tipranavir, ritonavir -St. John's wort This  list may not describe all possible interactions. Give your health care provider a list of all the medicines, herbs, non-prescription drugs, or dietary supplements you use. Also tell them if you smoke, drink alcohol, or use illegal drugs. Some items may interact with your medicine. What should I watch for while using this medicine? This product does not protect you against HIV infection (AIDS) or other sexually transmitted diseases. You should be able to feel the implant by pressing your fingertips over the skin where it was inserted. Contact your doctor if you cannot feel the implant, and use a non-hormonal birth control method (such as condoms) until your doctor confirms that the implant is in place. If you feel that the implant may have broken or become bent while in your arm, contact your healthcare provider. What side effects may I notice from receiving this medicine? Side effects that you should report to your doctor or health care professional as soon as possible: -allergic reactions like skin rash, itching or hives, swelling of the face, lips, or tongue -breast lumps -changes in emotions or moods -depressed mood -heavy or prolonged menstrual bleeding -pain, irritation, swelling, or bruising at the insertion site -scar at site of insertion -signs of infection at the insertion site such as fever, and skin redness, pain or discharge -signs of pregnancy -signs and symptoms of a blood clot such as breathing problems; changes in vision; chest pain; severe, sudden headache; pain, swelling, warmth in the leg; trouble speaking; sudden numbness or weakness of the face, arm or leg -signs and symptoms of liver injury like dark yellow or brown urine; general ill feeling or flu-like symptoms; light-colored stools; loss of appetite; nausea; right upper belly pain; unusually weak or tired; yellowing of the eyes or skin -unusual vaginal bleeding, discharge -signs and symptoms of a stroke like changes in  vision; confusion; trouble speaking or understanding; severe headaches; sudden numbness or weakness of the face, arm or leg; trouble walking; dizziness; loss of balance or coordination Side effects that usually do not require medical attention (Report these to your doctor or health care professional if they continue or are bothersome.): -acne -back pain -breast pain -changes in weight -dizziness -general ill feeling or flu-like symptoms -headache -irregular menstrual bleeding -nausea -sore throat -vaginal irritation or inflammation This list may not describe all possible side effects. Call your doctor for medical advice about side effects. You may report side effects to FDA at 1-800-FDA-1088. Where should I keep my medicine? This drug is given in a hospital or clinic and will not be stored at home. NOTE: This sheet is a summary. It may not cover all possible information. If you have questions about this medicine, talk to your doctor, pharmacist, or health care provider.    2016, Elsevier/Gold Standard. (2014-05-20 14:07:06)

## 2016-06-03 ENCOUNTER — Ambulatory Visit: Payer: Medicaid Other

## 2016-06-03 ENCOUNTER — Encounter: Payer: Self-pay | Admitting: *Deleted

## 2016-06-03 ENCOUNTER — Other Ambulatory Visit: Payer: Medicaid Other

## 2016-06-10 IMAGING — US US OB NUCHAL TRANSLUCENCY 1ST GEST
1 series · 14 of 20 positions shown · non-contrast
Comparison: none

CLINICAL DATA: First trimester aneuploidy screening.

EXAM:
FETAL NUCHAL TRANSLUCENCY ULTRASOUND

[Series 1: us ob nuchal translucency 1st gest · 0.08mm/px · 14 of 20 slices shown]
[im 1/20]
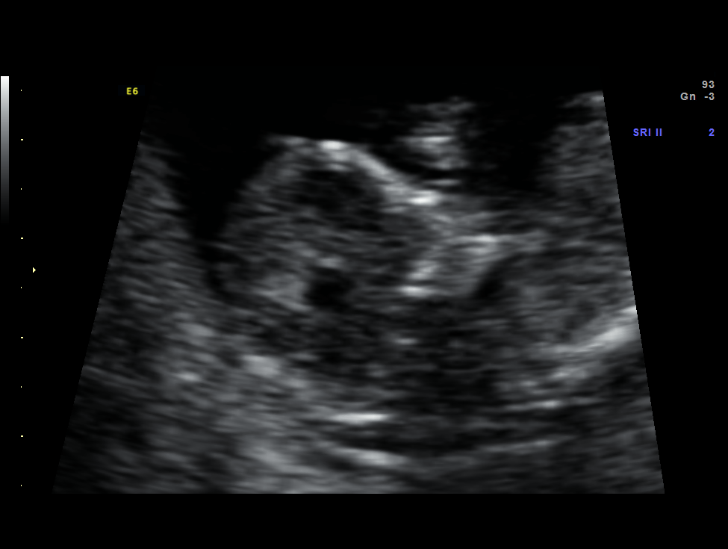
[im 3/20]
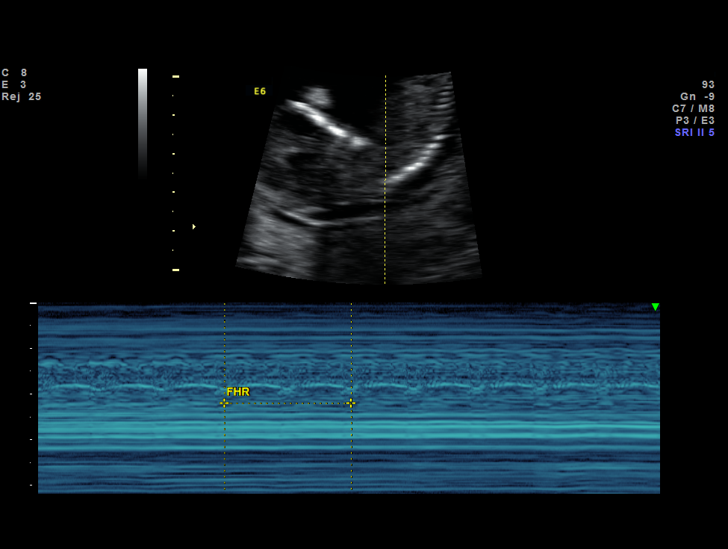
[im 4/20]
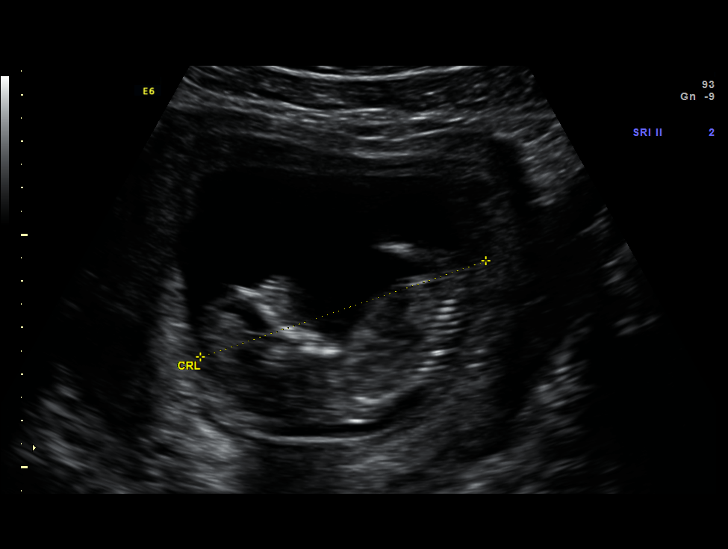
[im 6/20]
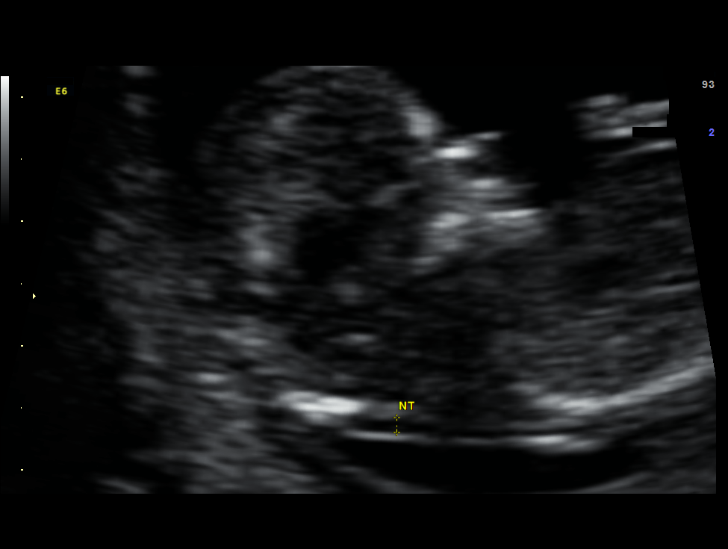
[im 7/20]
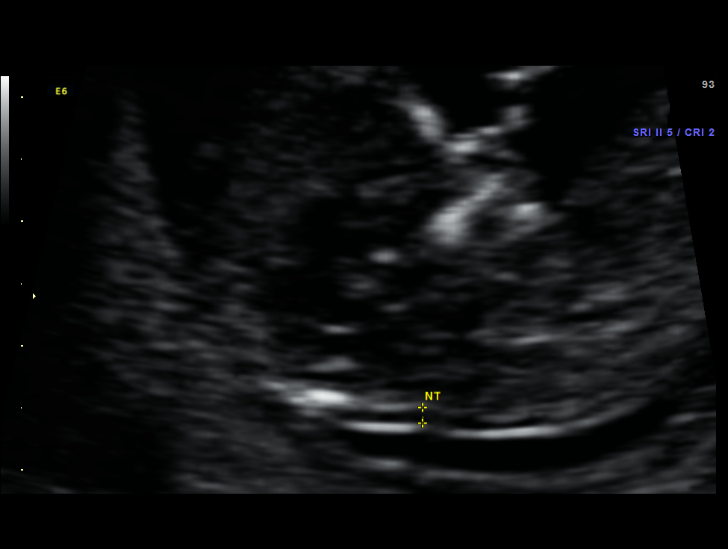
[im 8/20]
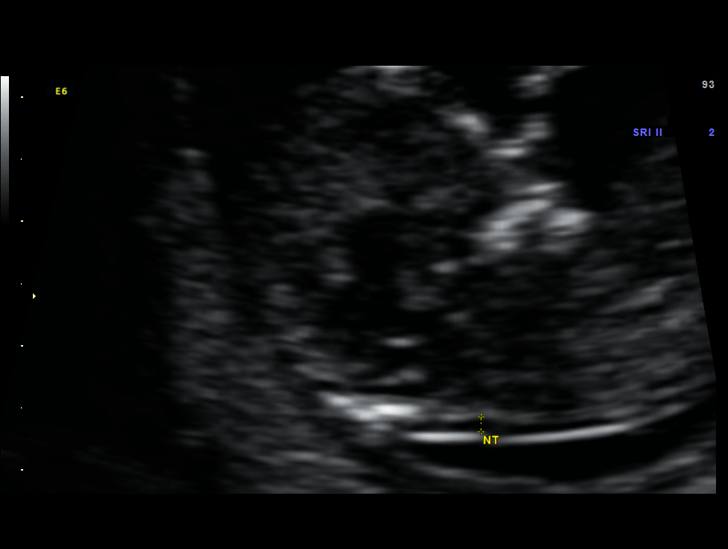
[im 10/20]
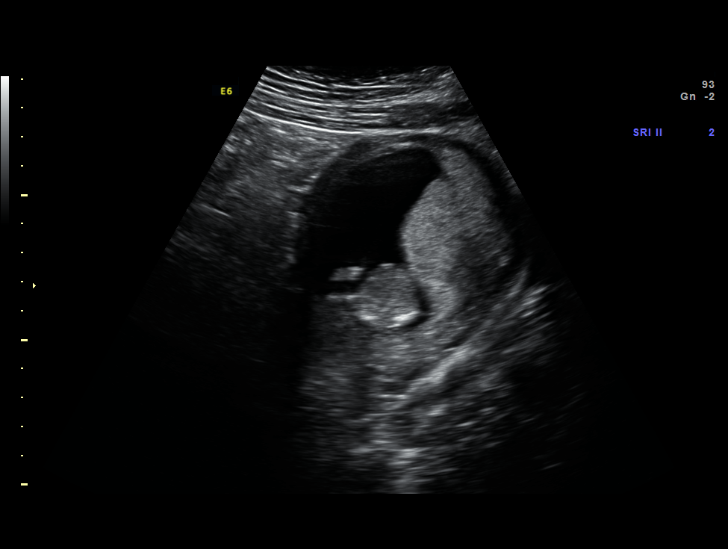
[im 11/20]
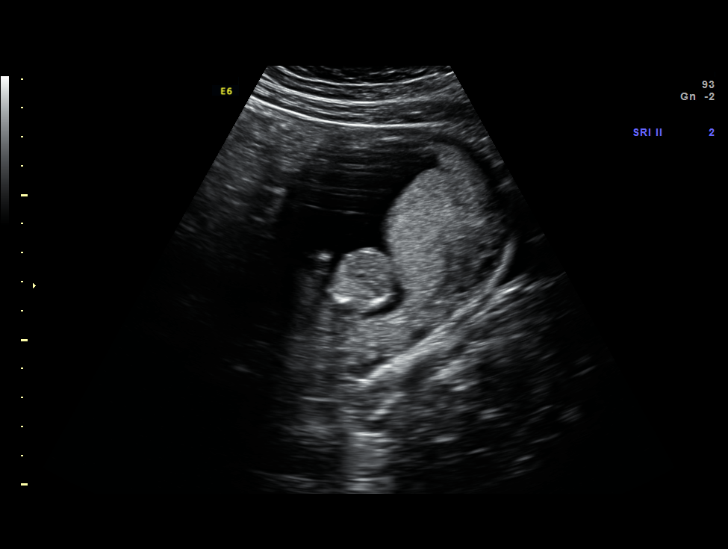
[im 13/20]
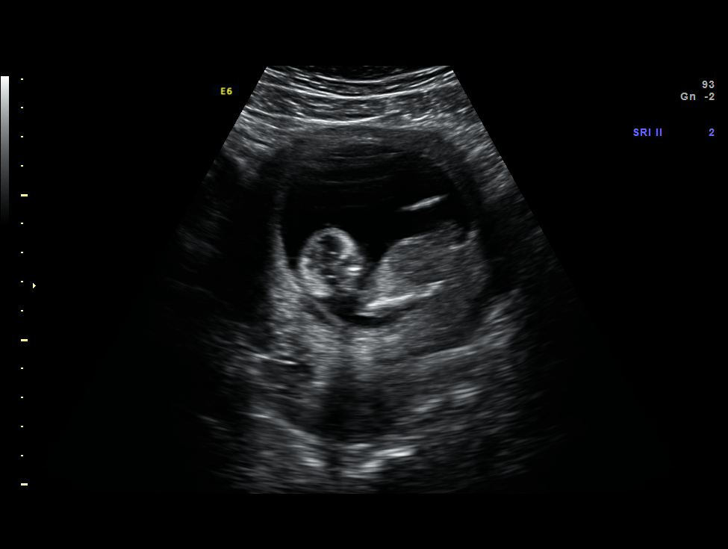
[im 14/20]
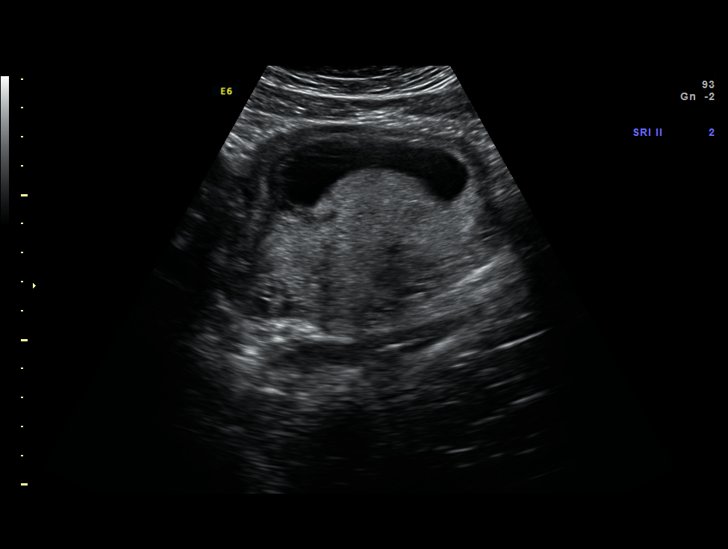
[im 16/20]
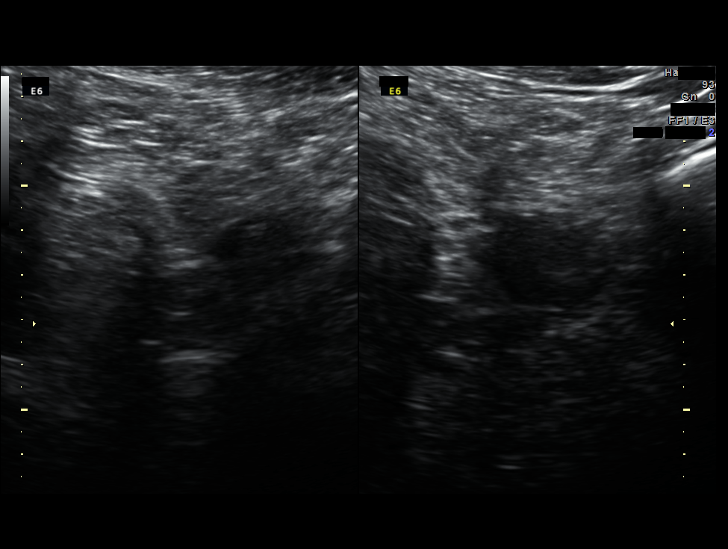
[im 17/20]
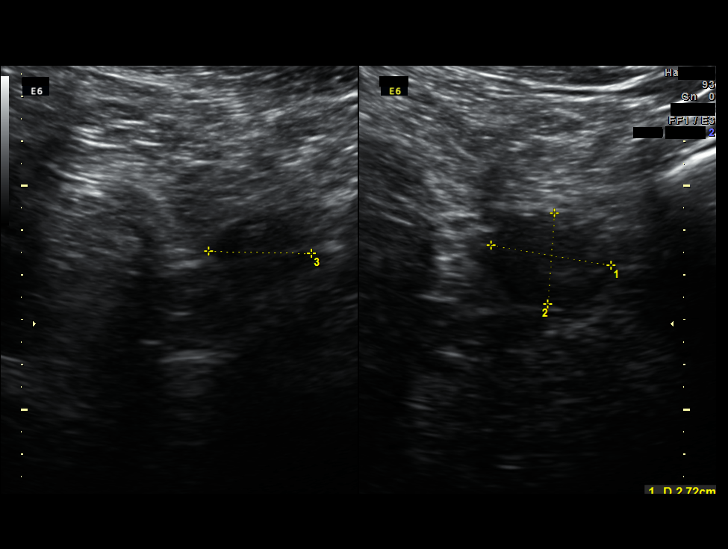
[im 18/20]
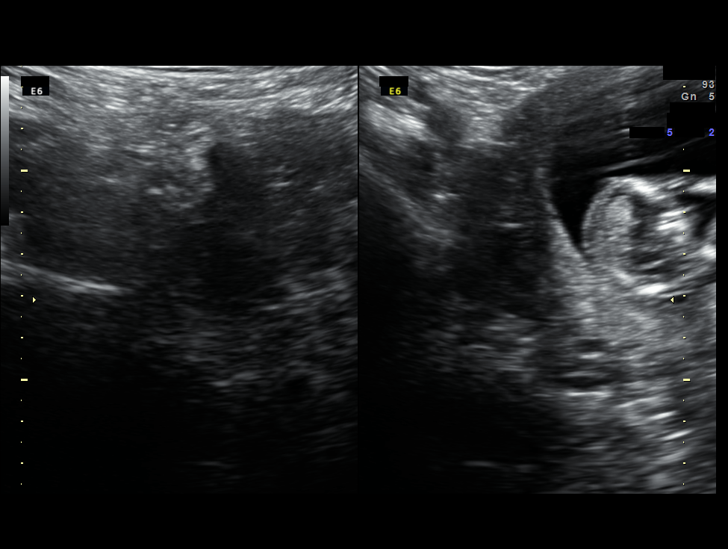
[im 20/20]
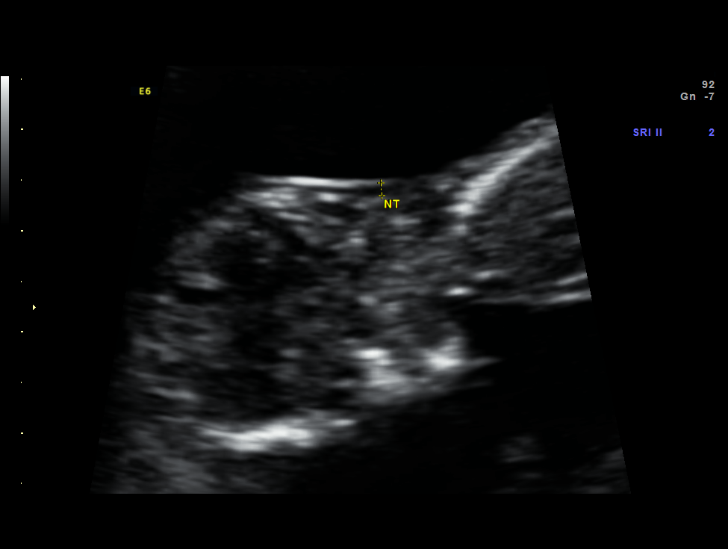

[14 of 20 positions shown; findings below may reference images not displayed]

FINDINGS: Number of Fetuses: 1

Heart Rate:  159 beats per minute bpm

CRL  64.7mm 12w  6d              EDC: April 23, 2016

Nuchal Translucency: 1.24mm

Nasal bone:  Present

Early Anatomic Landmarks:  No abnormalities noted.
IMPRESSION: Fetal nuchal translucency measures 1.24 mm relative to CRL of
mm. This is at the 20th percentile for this crown rump length.
Visualized early anatomic landmarks are unremarkable.

## 2016-09-19 ENCOUNTER — Encounter: Payer: Self-pay | Admitting: Adult Health

## 2016-09-19 ENCOUNTER — Ambulatory Visit: Payer: Medicaid Other | Admitting: Adult Health

## 2016-10-04 ENCOUNTER — Other Ambulatory Visit: Payer: Self-pay | Admitting: Obstetrics and Gynecology

## 2016-10-04 DIAGNOSIS — O3680X0 Pregnancy with inconclusive fetal viability, not applicable or unspecified: Secondary | ICD-10-CM

## 2016-10-07 ENCOUNTER — Other Ambulatory Visit: Payer: Self-pay | Admitting: Obstetrics and Gynecology

## 2016-10-07 ENCOUNTER — Other Ambulatory Visit: Payer: Medicaid Other

## 2016-10-07 ENCOUNTER — Ambulatory Visit (INDEPENDENT_AMBULATORY_CARE_PROVIDER_SITE_OTHER): Payer: Medicaid Other

## 2016-10-07 DIAGNOSIS — Z3682 Encounter for antenatal screening for nuchal translucency: Secondary | ICD-10-CM

## 2016-10-07 DIAGNOSIS — O3680X0 Pregnancy with inconclusive fetal viability, not applicable or unspecified: Secondary | ICD-10-CM

## 2016-10-07 DIAGNOSIS — Z3A12 12 weeks gestation of pregnancy: Secondary | ICD-10-CM

## 2016-10-07 NOTE — Progress Notes (Signed)
US 12+1 wks,crl 57.5 mm,normal ov's bilat,NB present,NT 1.7 mm,fhr 162 bpm,EDD 04/20/2017

## 2016-10-16 LAB — MATERNAL SCREEN, INTEGRATED #1
CROWN RUMP LENGTH MAT SCREEN: 57.5 mm
GEST. AGE ON COLLECTION DATE: 12.3 wk
Maternal Age at EDD: 23.6 years
Nuchal Translucency (NT): 1.7 mm
Number of Fetuses: 1
PAPP-A VALUE: 617.9 ng/mL
WEIGHT: 170 [lb_av]

## 2016-10-23 ENCOUNTER — Encounter: Payer: Medicaid Other | Admitting: Women's Health

## 2016-10-23 ENCOUNTER — Ambulatory Visit: Payer: Medicaid Other | Admitting: *Deleted

## 2016-10-23 ENCOUNTER — Encounter: Payer: Self-pay | Admitting: *Deleted

## 2016-10-31 ENCOUNTER — Encounter: Payer: Medicaid Other | Admitting: Advanced Practice Midwife

## 2016-10-31 ENCOUNTER — Ambulatory Visit: Payer: Medicaid Other | Admitting: *Deleted

## 2016-10-31 ENCOUNTER — Encounter: Payer: Self-pay | Admitting: *Deleted

## 2016-11-28 ENCOUNTER — Ambulatory Visit: Payer: Medicaid Other | Admitting: *Deleted

## 2016-11-28 ENCOUNTER — Ambulatory Visit (INDEPENDENT_AMBULATORY_CARE_PROVIDER_SITE_OTHER): Payer: Medicaid Other | Admitting: Advanced Practice Midwife

## 2016-11-28 ENCOUNTER — Encounter: Payer: Self-pay | Admitting: Advanced Practice Midwife

## 2016-11-28 VITALS — BP 104/68 | HR 82 | Wt 176.0 lb

## 2016-11-28 DIAGNOSIS — O0932 Supervision of pregnancy with insufficient antenatal care, second trimester: Secondary | ICD-10-CM

## 2016-11-28 DIAGNOSIS — Z331 Pregnant state, incidental: Secondary | ICD-10-CM

## 2016-11-28 DIAGNOSIS — Z363 Encounter for antenatal screening for malformations: Secondary | ICD-10-CM

## 2016-11-28 DIAGNOSIS — Z3490 Encounter for supervision of normal pregnancy, unspecified, unspecified trimester: Secondary | ICD-10-CM | POA: Insufficient documentation

## 2016-11-28 DIAGNOSIS — Z3482 Encounter for supervision of other normal pregnancy, second trimester: Secondary | ICD-10-CM | POA: Diagnosis not present

## 2016-11-28 DIAGNOSIS — Z1389 Encounter for screening for other disorder: Secondary | ICD-10-CM

## 2016-11-28 DIAGNOSIS — O0930 Supervision of pregnancy with insufficient antenatal care, unspecified trimester: Secondary | ICD-10-CM | POA: Insufficient documentation

## 2016-11-28 DIAGNOSIS — Z1379 Encounter for other screening for genetic and chromosomal anomalies: Secondary | ICD-10-CM

## 2016-11-28 DIAGNOSIS — Z3A19 19 weeks gestation of pregnancy: Secondary | ICD-10-CM

## 2016-11-28 LAB — POCT URINALYSIS DIPSTICK
GLUCOSE UA: NEGATIVE
Ketones, UA: NEGATIVE
Leukocytes, UA: NEGATIVE
Nitrite, UA: NEGATIVE
RBC UA: NEGATIVE

## 2016-11-28 NOTE — Progress Notes (Signed)
Subjective:    Adriana Perry is a F1T0211 61w4dbeing seen today for her first obstetrical visit.  Her obstetrical history is significant for term SVD x 2 .  Pregnancy history fully reviewed. Late PNC at 19 weeks.    Patient reports no complaints.  Vitals:   11/28/16 1348  BP: 104/68  Pulse: 82  Weight: 176 lb (79.8 kg)    HISTORY: OB History  Gravida Para Term Preterm AB Living  3 2 2     2   SAB TAB Ectopic Multiple Live Births        0 2    # Outcome Date GA Lbr Len/2nd Weight Sex Delivery Anes PTL Lv  3 Current           2 Term 04/21/16 364w5d6 lb 6 oz (2.892 kg) M Vag-Spont EPI N LIV  1 Term 06/22/14 3828w5d:18 / 01:17 6 lb 2.6 oz (2.795 kg) M Vag-Spont EPI N LIV     Birth Comments: none     Past Medical History:  Diagnosis Date  . Bleeding 08/29/2015  . Medical history non-contributory   . Nausea 09/21/2015  . Supervision of normal pregnancy in first trimester 09/21/2015    [facility]   9+2 weeks FOB  Tai Fitzgerald 22 yo BM 2nd  Dating By   US Koreap  GC/CT Initial:                36+wks: Genetic Screen NT/IT:  CF screen  Anatomic US [place] vaccine  Tdap Recommended ~ 28wks Glucose Screen  2 hr GBS  Feed Preference  Contraception  Circumcision  Childbirth Classes  Pediatrician     Past Surgical History:  Procedure Laterality Date  . NO PAST SURGERIES     Family History  Problem Relation Age of Onset  . Hypertension Paternal Grandfather   . Diabetes Paternal Grandfather   . Thyroid disease Paternal Grandfather   . Hypertension Paternal Grandmother   . Diabetes Paternal Grandmother   . Diabetes Maternal Grandfather   . Hypertension Maternal Grandfather      Exam                                      System:     Skin: normal coloration and turgor, no rashes    Neurologic: oriented, normal, normal mood   Extremities: normal strength, tone, and muscle mass   HEENT PERRLA   Mouth/Teeth mucous membranes moist, norma;l dentition    Neck supple and no masses   Cardiovascular: regular rate and rhythm   Respiratory:  appears well, vitals normal, no respiratory distress, acyanotic   Abdomen: soft, non-tender;  FHR: 160          Assessment:    Pregnancy: [id number] Active Problem List   Diagnosis Date Noted  . Encounter for supervision of other normal pregnancy 11/28/2016  . Late prenatal care 11/28/2016  . Trichomonal vaginitis in pregnancy 05/10/2014  . Marijuana use 11/15/2013        Plan:     Initial labs drawn. Continue prenatal vitamins  Problem list reviewed and updated  Reviewed n/v relief measures and warning s/s to report  Reviewed recommended weight gain based on pre-gravid BMI  Encouraged well-balanced diet Genetic Screening discussed Integrated Screen: requested.  Ultrasound discussed; fetal survey: requested.  Return for ASAP for anatomy scan only/ 4 weeks for  LROBWyvonnia Dusky 11/28/2016

## 2016-11-28 NOTE — Patient Instructions (Signed)
Safe Medications in Pregnancy   Acne: Benzoyl Peroxide Salicylic Acid  Backache/Headache: Tylenol: 2 regular strength every 4 hours OR              2 Extra strength every 6 hours  Colds/Coughs/Allergies: Benadryl (alcohol free) 25 mg every 6 hours as needed Breath right strips Claritin Cepacol throat lozenges Chloraseptic throat spray Cold-Eeze- up to three times per day Cough drops, alcohol free Flonase (by prescription only) Guaifenesin Mucinex Robitussin DM (plain only, alcohol free) Saline nasal spray/drops Sudafed (pseudoephedrine) & Actifed ** use only after [redacted] weeks gestation and if you do not have high blood pressure Tylenol Vicks Vaporub Zinc lozenges Zyrtec   Constipation: Colace Ducolax suppositories Fleet enema Glycerin suppositories Metamucil Milk of magnesia Miralax Senokot Smooth move tea  Diarrhea: Kaopectate Imodium A-D  *NO pepto Bismol  Hemorrhoids: Anusol Anusol HC Preparation H Tucks  Indigestion: Tums Maalox Mylanta Zantac  Pepcid  Insomnia: Benadryl (alcohol free) 25mg every 6 hours as needed Tylenol PM Unisom, no Gelcaps  Leg Cramps: Tums MagGel  Nausea/Vomiting:  Bonine Dramamine Emetrol Ginger extract Sea bands Meclizine  Nausea medication to take during pregnancy:  Unisom (doxylamine succinate 25 mg tablets) Take one tablet daily at bedtime. If symptoms are not adequately controlled, the dose can be increased to a maximum recommended dose of two tablets daily (1/2 tablet in the morning, 1/2 tablet mid-afternoon and one at bedtime). Vitamin B6 100mg tablets. Take one tablet twice a day (up to 200 mg per day).  Skin Rashes: Aveeno products Benadryl cream or 25mg every 6 hours as needed Calamine Lotion 1% cortisone cream  Yeast infection: Gyne-lotrimin 7 Monistat 7   **If taking multiple medications, please check labels to avoid duplicating the same active ingredients **take medication as directed on  the label ** Do not exceed 4000 mg of tylenol in 24 hours **Do not take medications that contain aspirin or ibuprofen     Second Trimester of Pregnancy The second trimester is from week 14 through week 27 (months 4 through 6). The second trimester is often a time when you feel your best. Your body has adjusted to being pregnant, and you begin to feel better physically. Usually, morning sickness has lessened or quit completely, you may have more energy, and you may have an increase in appetite. The second trimester is also a time when the fetus is growing rapidly. At the end of the sixth month, the fetus is about 9 inches long and weighs about 1 pounds. You will likely begin to feel the baby move (quickening) between 16 and 20 weeks of pregnancy. Body changes during your second trimester Your body continues to go through many changes during your second trimester. The changes vary from woman to woman.  Your weight will continue to increase. You will notice your lower abdomen bulging out.  You may begin to get stretch marks on your hips, abdomen, and breasts.  You may develop headaches that can be relieved by medicines. The medicines should be approved by your health care provider.  You may urinate more often because the fetus is pressing on your bladder.  You may develop or continue to have heartburn as a result of your pregnancy.  You may develop constipation because certain hormones are causing the muscles that push waste through your intestines to slow down.  You may develop hemorrhoids or swollen, bulging veins (varicose veins).  You may have back pain. This is caused by: ? Weight gain. ? Pregnancy hormones that are   relaxing the joints in your pelvis. ? A shift in weight and the muscles that support your balance.  Your breasts will continue to grow and they will continue to become tender.  Your gums may bleed and may be sensitive to brushing and flossing.  Dark spots or blotches  (chloasma, mask of pregnancy) may develop on your face. This will likely fade after the baby is born.  A dark line from your belly button to the pubic area (linea nigra) may appear. This will likely fade after the baby is born.  You may have changes in your hair. These can include thickening of your hair, rapid growth, and changes in texture. Some women also have hair loss during or after pregnancy, or hair that feels dry or thin. Your hair will most likely return to normal after your baby is born.  What to expect at prenatal visits During a routine prenatal visit:  You will be weighed to make sure you and the fetus are growing normally.  Your blood pressure will be taken.  Your abdomen will be measured to track your baby's growth.  The fetal heartbeat will be listened to.  Any test results from the previous visit will be discussed.  Your health care provider may ask you:  How you are feeling.  If you are feeling the baby move.  If you have had any abnormal symptoms, such as leaking fluid, bleeding, severe headaches, or abdominal cramping.  If you are using any tobacco products, including cigarettes, chewing tobacco, and electronic cigarettes.  If you have any questions.  Other tests that may be performed during your second trimester include:  Blood tests that check for: ? Low iron levels (anemia). ? High blood sugar that affects pregnant women (gestational diabetes) between 24 and 28 weeks. ? Rh antibodies. This is to check for a protein on red blood cells (Rh factor).  Urine tests to check for infections, diabetes, or protein in the urine.  An ultrasound to confirm the proper growth and development of the baby.  An amniocentesis to check for possible genetic problems.  Fetal screens for spina bifida and Down syndrome.  HIV (human immunodeficiency virus) testing. Routine prenatal testing includes screening for HIV, unless you choose not to have this test.  Follow  these instructions at home: Medicines  Follow your health care provider's instructions regarding medicine use. Specific medicines may be either safe or unsafe to take during pregnancy.  Take a prenatal vitamin that contains at least 600 micrograms (mcg) of folic acid.  If you develop constipation, try taking a stool softener if your health care provider approves. Eating and drinking  Eat a balanced diet that includes fresh fruits and vegetables, whole grains, good sources of protein such as meat, eggs, or tofu, and low-fat dairy. Your health care provider will help you determine the amount of weight gain that is right for you.  Avoid raw meat and uncooked cheese. These carry germs that can cause birth defects in the baby.  If you have low calcium intake from food, talk to your health care provider about whether you should take a daily calcium supplement.  Limit foods that are high in fat and processed sugars, such as fried and sweet foods.  To prevent constipation: ? Drink enough fluid to keep your urine clear or pale yellow. ? Eat foods that are high in fiber, such as fresh fruits and vegetables, whole grains, and beans. Activity  Exercise only as directed by your health care provider.   Most women can continue their usual exercise routine during pregnancy. Try to exercise for 30 minutes at least 5 days a week. Stop exercising if you experience uterine contractions.  Avoid heavy lifting, wear low heel shoes, and practice good posture.  A sexual relationship may be continued unless your health care provider directs you otherwise. Relieving pain and discomfort  Wear a good support bra to prevent discomfort from breast tenderness.  Take warm sitz baths to soothe any pain or discomfort caused by hemorrhoids. Use hemorrhoid cream if your health care provider approves.  Rest with your legs elevated if you have leg cramps or low back pain.  If you develop varicose veins, wear support  hose. Elevate your feet for 15 minutes, 3-4 times a day. Limit salt in your diet. Prenatal Care  Write down your questions. Take them to your prenatal visits.  Keep all your prenatal visits as told by your health care provider. This is important. Safety  Wear your seat belt at all times when driving.  Make a list of emergency phone numbers, including numbers for family, friends, the hospital, and police and fire departments. General instructions  Ask your health care provider for a referral to a local prenatal education class. Begin classes no later than the beginning of month 6 of your pregnancy.  Ask for help if you have counseling or nutritional needs during pregnancy. Your health care provider can offer advice or refer you to specialists for help with various needs.  Do not use hot tubs, steam rooms, or saunas.  Do not douche or use tampons or scented sanitary pads.  Do not cross your legs for long periods of time.  Avoid cat litter boxes and soil used by cats. These carry germs that can cause birth defects in the baby and possibly loss of the fetus by miscarriage or stillbirth.  Avoid all smoking, herbs, alcohol, and unprescribed drugs. Chemicals in these products can affect the formation and growth of the baby.  Do not use any products that contain nicotine or tobacco, such as cigarettes and e-cigarettes. If you need help quitting, ask your health care provider.  Visit your dentist if you have not gone yet during your pregnancy. Use a soft toothbrush to brush your teeth and be gentle when you floss. Contact a health care provider if:  You have dizziness.  You have mild pelvic cramps, pelvic pressure, or nagging pain in the abdominal area.  You have persistent nausea, vomiting, or diarrhea.  You have a bad smelling vaginal discharge.  You have pain when you urinate. Get help right away if:  You have a fever.  You are leaking fluid from your vagina.  You have  spotting or bleeding from your vagina.  You have severe abdominal cramping or pain.  You have rapid weight gain or weight loss.  You have shortness of breath with chest pain.  You notice sudden or extreme swelling of your face, hands, ankles, feet, or legs.  You have not felt your baby move in over an hour.  You have severe headaches that do not go away when you take medicine.  You have vision changes. Summary  The second trimester is from week 14 through week 27 (months 4 through 6). It is also a time when the fetus is growing rapidly.  Your body goes through many changes during pregnancy. The changes vary from woman to woman.  Avoid all smoking, herbs, alcohol, and unprescribed drugs. These chemicals affect the formation and growth   your baby.  Do not use any tobacco products, such as cigarettes, chewing tobacco, and e-cigarettes. If you need help quitting, ask your health care provider.  Contact your health care provider if you have any questions. Keep all prenatal visits as told by your health care provider. This is important. This information is not intended to replace advice given to you by your health care provider. Make sure you discuss any questions you have with your health care provider. Document Released: 07/30/2001 Document Revised: 01/11/2016 Document Reviewed: 10/06/2012 [employer] Interactive Patient Education  2017 [employer]  

## 2016-11-29 ENCOUNTER — Ambulatory Visit (INDEPENDENT_AMBULATORY_CARE_PROVIDER_SITE_OTHER): Payer: Medicaid Other

## 2016-11-29 DIAGNOSIS — O0930 Supervision of pregnancy with insufficient antenatal care, unspecified trimester: Secondary | ICD-10-CM

## 2016-11-29 DIAGNOSIS — Z363 Encounter for antenatal screening for malformations: Secondary | ICD-10-CM | POA: Diagnosis not present

## 2016-11-29 DIAGNOSIS — Z3482 Encounter for supervision of other normal pregnancy, second trimester: Secondary | ICD-10-CM

## 2016-11-29 NOTE — Progress Notes (Signed)
[place] 19+5 wks,cephalic,post pl gr 0,normal ovaries bilat,cx 3 cm,svp 3.8 cm,fhr 162 bpm,efw 286 g,anatomy complete,no obvious abnormalities seen

## 2016-11-30 LAB — GC/CHLAMYDIA PROBE AMP
CHLAMYDIA, DNA PROBE: NEGATIVE
NEISSERIA GONORRHOEAE BY PCR: NEGATIVE

## 2016-11-30 LAB — URINE CULTURE

## 2016-12-26 ENCOUNTER — Encounter: Payer: Medicaid Other | Admitting: Women's Health

## 2016-12-26 ENCOUNTER — Encounter: Payer: Self-pay | Admitting: *Deleted

## 2017-01-23 ENCOUNTER — Encounter: Payer: Self-pay | Admitting: Advanced Practice Midwife

## 2017-01-23 ENCOUNTER — Ambulatory Visit (INDEPENDENT_AMBULATORY_CARE_PROVIDER_SITE_OTHER): Payer: Medicaid Other | Admitting: Advanced Practice Midwife

## 2017-01-23 VITALS — BP 100/60 | HR 84 | Wt 186.0 lb

## 2017-01-23 DIAGNOSIS — Z1389 Encounter for screening for other disorder: Secondary | ICD-10-CM

## 2017-01-23 DIAGNOSIS — O0930 Supervision of pregnancy with insufficient antenatal care, unspecified trimester: Secondary | ICD-10-CM

## 2017-01-23 DIAGNOSIS — Z3482 Encounter for supervision of other normal pregnancy, second trimester: Secondary | ICD-10-CM

## 2017-01-23 DIAGNOSIS — Z0283 Encounter for blood-alcohol and blood-drug test: Secondary | ICD-10-CM

## 2017-01-23 DIAGNOSIS — Z331 Pregnant state, incidental: Secondary | ICD-10-CM

## 2017-01-23 LAB — POCT URINALYSIS DIPSTICK
Glucose, UA: NEGATIVE
Ketones, UA: NEGATIVE
Leukocytes, UA: NEGATIVE
NITRITE UA: NEGATIVE
PROTEIN UA: NEGATIVE
RBC UA: NEGATIVE

## 2017-01-23 MED ORDER — PNV PRENATAL PLUS MULTIVITAMIN 27-1 MG PO TABS: 1.0000 | ORAL_TABLET | Freq: Every day | ORAL | 11 refills | Status: DC

## 2017-01-23 NOTE — Progress Notes (Signed)
[id number] Estimated Date of Delivery: 04/20/17  Blood pressure 100/60, pulse 84, weight 186 lb (84.4 kg), not currently breastfeeding.   Had initioal PNV at 19 weeks and hasn't been back.  Never got PN1 labs drawn . Doesn't have a reason for any of that   BP weight and urine results all reviewed and noted.  Please refer to the obstetrical flow sheet for the fundal height and fetal heart rate documentation:  Patient reports good fetal movement, denies any bleeding and no rupture of membranes symptoms or regular contractions. Patient is without complaints. All questions were answered.  Orders Placed This Encounter  Procedures  . POCT urinalysis dipstick    Plan:  Continued routine obstetrical care, add HBSAG to PN2 labs (have everything else)  Return for ASAP for PN2 only; 3 weeks for LROB.

## 2017-01-23 NOTE — Addendum Note (Signed)
Addended by: Federico FlakeNES, Malky Rudzinski on: 01/23/2017 04:06 PM   Modules accepted: Orders

## 2017-01-23 NOTE — Patient Instructions (Addendum)
1. Before your test, do not eat or drink anything for 8-10 hours prior to your  appointment (a small amount of water is allowed and you may take any medicines you normally take). Be sure to drink lots of water the day before. 2. When you arrive, your blood will be drawn for a 'fasting' blood sugar level.  Then you will be given a sweetened carbonated beverage to drink. You should  complete drinking this beverage within five minutes. After finishing the  beverage, you will have your blood drawn exactly 1 and 2 hours later. Having  your blood drawn on time is an important part of this test. A total of three blood  samples will be done. 3. The test takes approximately 2  hours. During the test, do not have anything to  eat or drink. Do not smoke, chew gum (not even sugarless gum) or use breath mints.  4. During the test you should remain close by and seated as much as possible and  avoid walking around. You may want to bring a book or something else to  occupy your time.  5. After your test, you may eat and drink as normal. You may want to bring a snack  to eat after the test is finished. Your provider will advise you as to the results of  this test and any follow-up if necessary  If your sugar test is positive for gestational diabetes, you will be given an phone call and further instructions discussed. If you wish to know all of your test results before your next appointment, feel free to call the office, or look up your test results on Mychart.  (The range that the lab uses for normal values of the sugar test are not necessarily the range that is used for pregnant women; if your results are within the normal range, they are definitely normal.  However, if a value is deemed "high" by the lab, it may not be too high for a pregnant woman.  We will need to discuss the results if your value(s) fall in the "high" category).     Tdap Vaccine  It is recommended that you get the Tdap vaccine during  the third trimester of EACH pregnancy to help protect your baby from getting pertussis (whooping cough)  27-36 weeks is the BEST time to do this so that you can pass the protection on to your baby. During pregnancy is better than after pregnancy, but if you are unable to get it during pregnancy it will be offered at the hospital.  You can get this vaccine at the health department or your family doctor, as well as some pharmacies.  Everyone who will be around your baby should also be up-to-date on their vaccines. Adults (who are not pregnant) only need 1 dose of Tdap during adulthood.       Call the office [phone]) or go to [facility] if:  You begin to have strong, frequent contractions  Your water breaks.  Sometimes it is a big gush of fluid, sometimes it is just a trickle that keeps getting your panties wet or running down your legs  You have vaginal bleeding.  It is normal to have a small amount of spotting if your cervix was checked.   You don't feel your baby moving like normal.  If you don't, get you something to eat and drink and lay down and focus on feeling your baby move.  You should feel at least 10 movements in  2 hours.  If you don't, you should call the office or go to [facility].     Third Trimester of Pregnancy The third trimester is from week 29 through week 42, months 7 through 9. The third trimester is a time when the fetus is growing rapidly. At the end of the ninth month, the fetus is about 20 inches in length and weighs 6-10 pounds.  BODY CHANGES Your body goes through many changes during pregnancy. The changes vary from woman to woman.   Your weight will continue to increase. You can expect to gain 25-35 pounds (11-16 kg) by the end of the pregnancy.  You may begin to get stretch marks on your hips, abdomen, and breasts.  You may urinate more often because the fetus is moving lower into your pelvis and pressing on your bladder.  You may develop or  continue to have heartburn as a result of your pregnancy.  You may develop constipation because certain hormones are causing the muscles that push waste through your intestines to slow down.  You may develop hemorrhoids or swollen, bulging veins (varicose veins).  You may have pelvic pain because of the weight gain and pregnancy hormones relaxing your joints between the bones in your pelvis. Backaches may result from overexertion of the muscles supporting your posture.  You may have changes in your hair. These can include thickening of your hair, rapid growth, and changes in texture. Some women also have hair loss during or after pregnancy, or hair that feels dry or thin. Your hair will most likely return to normal after your baby is born.  Your breasts will continue to grow and be tender. A yellow discharge may leak from your breasts called colostrum.  Your belly button may stick out.  You may feel short of breath because of your expanding uterus.  You may notice the fetus "dropping," or moving lower in your abdomen.  You may have a bloody mucus discharge. This usually occurs a few days to a week before labor begins.  Your cervix becomes thin and soft (effaced) near your due date. WHAT TO EXPECT AT YOUR PRENATAL EXAMS  You will have prenatal exams every 2 weeks until week 36. Then, you will have weekly prenatal exams. During a routine prenatal visit:  You will be weighed to make sure you and the fetus are growing normally.  Your blood pressure is taken.  Your abdomen will be measured to track your baby's growth.  The fetal heartbeat will be listened to.  Any test results from the previous visit will be discussed.  You may have a cervical check near your due date to see if you have effaced. At around 36 weeks, your caregiver will check your cervix. At the same time, your caregiver will also perform a test on the secretions of the vaginal tissue. This test is to determine if a type  of bacteria, Group B streptococcus, is present. Your caregiver will explain this further. Your caregiver may ask you:  What your birth plan is.  How you are feeling.  If you are feeling the baby move.  If you have had any abnormal symptoms, such as leaking fluid, bleeding, severe headaches, or abdominal cramping.  If you have any questions. Other tests or screenings that may be performed during your third trimester include:  Blood tests that check for low iron levels (anemia).  Fetal testing to check the health, activity level, and growth of the fetus. Testing is done if you  have certain medical conditions or if there are problems during the pregnancy. FALSE LABOR You may feel small, irregular contractions that eventually go away. These are called Braxton Hicks contractions, or false labor. Contractions may last for hours, days, or even weeks before true labor sets in. If contractions come at regular intervals, intensify, or become painful, it is best to be seen by your caregiver.  SIGNS OF LABOR   Menstrual-like cramps.  Contractions that are 5 minutes apart or less.  Contractions that start on the top of the uterus and spread down to the lower abdomen and back.  A sense of increased pelvic pressure or back pain.  A watery or bloody mucus discharge that comes from the vagina. If you have any of these signs before the 37th week of pregnancy, call your caregiver right away. You need to go to the hospital to get checked immediately. HOME CARE INSTRUCTIONS   Avoid all smoking, herbs, alcohol, and unprescribed drugs. These chemicals affect the formation and growth of the baby.  Follow your caregiver's instructions regarding medicine use. There are medicines that are either safe or unsafe to take during pregnancy.  Exercise only as directed by your caregiver. Experiencing uterine cramps is a good sign to stop exercising.  Continue to eat regular, healthy meals.  Wear a good  support bra for breast tenderness.  Do not use hot tubs, steam rooms, or saunas.  Wear your seat belt at all times when driving.  Avoid raw meat, uncooked cheese, cat litter boxes, and soil used by cats. These carry germs that can cause birth defects in the baby.  Take your prenatal vitamins.  Try taking a stool softener (if your caregiver approves) if you develop constipation. Eat more high-fiber foods, such as fresh vegetables or fruit and whole grains. Drink plenty of fluids to keep your urine clear or pale yellow.  Take warm sitz baths to soothe any pain or discomfort caused by hemorrhoids. Use hemorrhoid cream if your caregiver approves.  If you develop varicose veins, wear support hose. Elevate your feet for 15 minutes, 3-4 times a day. Limit salt in your diet.  Avoid heavy lifting, wear low heal shoes, and practice good posture.  Rest a lot with your legs elevated if you have leg cramps or low back pain.  Visit your dentist if you have not gone during your pregnancy. Use a soft toothbrush to brush your teeth and be gentle when you floss.  A sexual relationship may be continued unless your caregiver directs you otherwise.  Do not travel far distances unless it is absolutely necessary and only with the approval of your caregiver.  Take prenatal classes to understand, practice, and ask questions about the labor and delivery.  Make a trial run to the hospital.  Pack your hospital bag.  Prepare the baby's nursery.  Continue to go to all your prenatal visits as directed by your caregiver. SEEK MEDICAL CARE IF:  You are unsure if you are in labor or if your water has broken.  You have dizziness.  You have mild pelvic cramps, pelvic pressure, or nagging pain in your abdominal area.  You have persistent nausea, vomiting, or diarrhea.  You have a bad smelling vaginal discharge.  You have pain with urination. SEEK IMMEDIATE MEDICAL CARE IF:   You have a fever.  You are  leaking fluid from your vagina.  You have spotting or bleeding from your vagina.  You have severe abdominal cramping or pain.  You have  rapid weight loss or gain.  You have shortness of breath with chest pain.  You notice sudden or extreme swelling of your face, hands, ankles, feet, or legs.  You have not felt your baby move in over an hour.  You have severe headaches that do not go away with medicine.  You have vision changes. Document Released: 07/30/2001 Document Revised: 08/10/2013 Document Reviewed: 10/06/2012 [facility] Patient Information 2015 [place], [place]. This information is not intended to replace advice given to you by your health care provider. Make sure you discuss any questions you have with your health care provider.

## 2017-01-24 ENCOUNTER — Other Ambulatory Visit: Payer: Medicaid Other

## 2017-01-24 DIAGNOSIS — Z3482 Encounter for supervision of other normal pregnancy, second trimester: Secondary | ICD-10-CM

## 2017-01-24 DIAGNOSIS — Z3A27 27 weeks gestation of pregnancy: Secondary | ICD-10-CM

## 2017-01-24 DIAGNOSIS — Z131 Encounter for screening for diabetes mellitus: Secondary | ICD-10-CM

## 2017-01-24 LAB — PMP SCREEN PROFILE (10S), URINE
AMPHETAMINE SCREEN URINE: NEGATIVE ng/mL
BARBITURATE SCREEN URINE: NEGATIVE ng/mL
BENZODIAZEPINE SCREEN, URINE: NEGATIVE ng/mL
CANNABINOIDS UR QL SCN: POSITIVE ng/mL
COCAINE(METAB.)SCREEN, URINE: NEGATIVE ng/mL
Creatinine(Crt), U: 60.4 mg/dL (ref 20.0–300.0)
Methadone Screen, Urine: NEGATIVE ng/mL
OPIATE SCREEN URINE: NEGATIVE ng/mL
OXYCODONE+OXYMORPHONE UR QL SCN: NEGATIVE ng/mL
PH UR, DRUG SCRN: 6.2 (ref 4.5–8.9)
PHENCYCLIDINE QUANTITATIVE URINE: NEGATIVE ng/mL
Propoxyphene Scrn, Ur: NEGATIVE ng/mL

## 2017-01-24 LAB — MED LIST OPTION NOT SELECTED

## 2017-01-25 LAB — HIV ANTIBODY (ROUTINE TESTING W REFLEX): HIV Screen 4th Generation wRfx: NONREACTIVE

## 2017-01-25 LAB — ANTIBODY SCREEN: ANTIBODY SCREEN: NEGATIVE

## 2017-01-25 LAB — CBC
Hematocrit: 33.9 % — ABNORMAL LOW (ref 34.0–46.6)
Hemoglobin: 11.2 g/dL (ref 11.1–15.9)
MCH: 32.1 pg (ref 26.6–33.0)
MCHC: 33 g/dL (ref 31.5–35.7)
MCV: 97 fL (ref 79–97)
PLATELETS: 292 10*3/uL (ref 150–379)
RBC: 3.49 x10E6/uL — ABNORMAL LOW (ref 3.77–5.28)
RDW: 13.9 % (ref 12.3–15.4)
WBC: 8.5 10*3/uL (ref 3.4–10.8)

## 2017-01-25 LAB — RPR: RPR Ser Ql: NONREACTIVE

## 2017-01-25 LAB — GLUCOSE TOLERANCE, 2 HOURS W/ 1HR
GLUCOSE, 1 HOUR: 72 mg/dL (ref 65–179)
GLUCOSE, 2 HOUR: 78 mg/dL (ref 65–152)
GLUCOSE, FASTING: 65 mg/dL (ref 65–91)

## 2017-02-13 ENCOUNTER — Ambulatory Visit (INDEPENDENT_AMBULATORY_CARE_PROVIDER_SITE_OTHER): Payer: Medicaid Other | Admitting: Women's Health

## 2017-02-13 ENCOUNTER — Encounter: Payer: Self-pay | Admitting: Women's Health

## 2017-02-13 VITALS — BP 122/70 | HR 111 | Wt 188.0 lb

## 2017-02-13 DIAGNOSIS — Z331 Pregnant state, incidental: Secondary | ICD-10-CM

## 2017-02-13 DIAGNOSIS — Z3483 Encounter for supervision of other normal pregnancy, third trimester: Secondary | ICD-10-CM

## 2017-02-13 DIAGNOSIS — Z1389 Encounter for screening for other disorder: Secondary | ICD-10-CM

## 2017-02-13 NOTE — Progress Notes (Addendum)
Low-risk OB appointment [id number] Estimated Date of Delivery: 04/20/17 BP 122/70   Pulse (!) 111   Wt 188 lb (85.3 kg)   LMP  (LMP Unknown)   BMI 32.27 kg/m   BP, weight reviewed.  Unable to void, denies problems. Refer to obstetrical flow sheet for FH & FHR.  Reports good fm.  Denies regular uc's, lof, vb, or uti s/s. No complaints. Wants tubes tied, discussed permanency and high incidence of regret <23yo, LARCs just as effective and temporary, and nothing 100% other than just not having sex. Decided she wants Nexplanon.  Reviewed ptl s/s, fkc, pn2 results. Plan:  Continue routine obstetrical care  F/U in 2wks for OB appointment  Rubella, HepB today (not drawn earlier)

## 2017-02-13 NOTE — Patient Instructions (Signed)
Call the office ([phone]) or go to [facility] if:  You begin to have strong, frequent contractions  Your water breaks.  Sometimes it is a big gush of fluid, sometimes it is just a trickle that keeps getting your panties wet or running down your legs  You have vaginal bleeding.  It is normal to have a small amount of spotting if your cervix was checked.   You don't feel your baby moving like normal.  If you don't, get you something to eat and drink and lay down and focus on feeling your baby move.  You should feel at least 10 movements in 2 hours.  If you don't, you should call the office or go to [facility].    Tdap Vaccine  It is recommended that you get the Tdap vaccine during the third trimester of EACH pregnancy to help protect your baby from getting pertussis (whooping cough)  27-36 weeks is the BEST time to do this so that you can pass the protection on to your baby. During pregnancy is better than after pregnancy, but if you are unable to get it during pregnancy it will be offered at the hospital.   You can get this vaccine at the health department or your family doctor  Everyone who will be around your baby should also be up-to-date on their vaccines. Adults (who are not pregnant) only need 1 dose of Tdap during adulthood.     Preterm Labor and Birth Information The normal length of a pregnancy is 39-41 weeks. Preterm labor is when labor starts before 37 completed weeks of pregnancy. What are the risk factors for preterm labor? Preterm labor is more likely to occur in women who:  Have certain infections during pregnancy such as a bladder infection, sexually transmitted infection, or infection inside the uterus (chorioamnionitis).  Have a shorter-than-normal cervix.  Have gone into preterm labor before.  Have had surgery on their cervix.  Are younger than age 17 or older than age 35.  Are African American.  Are pregnant with twins or multiple babies (multiple  gestation).  Take street drugs or smoke while pregnant.  Do not gain enough weight while pregnant.  Became pregnant shortly after having been pregnant.  What are the symptoms of preterm labor? Symptoms of preterm labor include:  Cramps similar to those that can happen during a menstrual period. The cramps may happen with diarrhea.  Pain in the abdomen or lower back.  Regular uterine contractions that may feel like tightening of the abdomen.  A feeling of increased pressure in the pelvis.  Increased watery or bloody mucus discharge from the vagina.  Water breaking (ruptured amniotic sac).  Why is it important to recognize signs of preterm labor? It is important to recognize signs of preterm labor because babies who are born prematurely may not be fully developed. This can put them at an increased risk for:  Long-term (chronic) heart and lung problems.  Difficulty immediately after birth with regulating body systems, including blood sugar, body temperature, heart rate, and breathing rate.  Bleeding in the brain.  Cerebral palsy.  Learning difficulties.  Death.  These risks are highest for babies who are born before 34 weeks of pregnancy. How is preterm labor treated? Treatment depends on the length of your pregnancy, your condition, and the health of your baby. It may involve:  Having a stitch (suture) placed in your cervix to prevent your cervix from opening too early (cerclage).  Taking or being given medicines,   such as: ? Hormone medicines. These may be given early in pregnancy to help support the pregnancy. ? Medicine to stop contractions. ? Medicines to help mature the baby's lungs. These may be prescribed if the risk of delivery is high. ? Medicines to prevent your baby from developing cerebral palsy.  If the labor happens before 34 weeks of pregnancy, you may need to stay in the hospital. What should I do if I think I am in preterm labor? If you think that you  are going into preterm labor, call your health care provider right away. How can I prevent preterm labor in future pregnancies? To increase your chance of having a full-term pregnancy:  Do not use any tobacco products, such as cigarettes, chewing tobacco, and e-cigarettes. If you need help quitting, ask your health care provider.  Do not use street drugs or medicines that have not been prescribed to you during your pregnancy.  Talk with your health care provider before taking any herbal supplements, even if you have been taking them regularly.  Make sure you gain a healthy amount of weight during your pregnancy.  Watch for infection. If you think that you might have an infection, get it checked right away.  Make sure to tell your health care provider if you have gone into preterm labor before.  This information is not intended to replace advice given to you by your health care provider. Make sure you discuss any questions you have with your health care provider. Document Released: 10/26/2003 Document Revised: 01/16/2016 Document Reviewed: 12/27/2015 Elsevier Interactive Patient Education  2018 Elsevier Inc.  

## 2017-02-27 ENCOUNTER — Encounter: Payer: Self-pay | Admitting: *Deleted

## 2017-02-27 ENCOUNTER — Encounter: Payer: Medicaid Other | Admitting: Women's Health

## 2017-03-13 ENCOUNTER — Ambulatory Visit (INDEPENDENT_AMBULATORY_CARE_PROVIDER_SITE_OTHER): Payer: Medicaid Other | Admitting: Advanced Practice Midwife

## 2017-03-13 ENCOUNTER — Encounter: Payer: Self-pay | Admitting: Advanced Practice Midwife

## 2017-03-13 VITALS — BP 110/72 | HR 88 | Wt 196.0 lb

## 2017-03-13 DIAGNOSIS — Z331 Pregnant state, incidental: Secondary | ICD-10-CM

## 2017-03-13 DIAGNOSIS — O368131 Decreased fetal movements, third trimester, fetus 1: Secondary | ICD-10-CM | POA: Diagnosis not present

## 2017-03-13 DIAGNOSIS — Z3A34 34 weeks gestation of pregnancy: Secondary | ICD-10-CM | POA: Diagnosis not present

## 2017-03-13 DIAGNOSIS — O26893 Other specified pregnancy related conditions, third trimester: Secondary | ICD-10-CM

## 2017-03-13 DIAGNOSIS — Z3483 Encounter for supervision of other normal pregnancy, third trimester: Secondary | ICD-10-CM

## 2017-03-13 DIAGNOSIS — Z1389 Encounter for screening for other disorder: Secondary | ICD-10-CM

## 2017-03-13 LAB — POCT URINALYSIS DIPSTICK
GLUCOSE UA: NEGATIVE
KETONES UA: NEGATIVE
Nitrite, UA: NEGATIVE
PROTEIN UA: NEGATIVE
RBC UA: NEGATIVE

## 2017-03-13 MED ORDER — CYCLOBENZAPRINE HCL 10 MG PO TABS: 10.0000 mg | ORAL_TABLET | Freq: Three times a day (TID) | ORAL | 1 refills | Status: DC | PRN

## 2017-03-13 NOTE — Patient Instructions (Addendum)
Round Ligament Pain During Pregnancy   Round ligament pain is a sharp pain or jabbing feeling often felt in the lower belly or groin area on one or both sides. It is one of the most common complaints during pregnancy and is considered a normal part of pregnancy. It is most often felt during the second trimester.   Here is what you need to know about round ligament pain, including some tips to help you feel better.   Causes of Round Ligament Pain   Several thick ligaments surround and support your womb (uterus) as it grows during pregnancy. One of them is called the round ligament.   The round ligament connects the front part of the womb to your groin, the area where your legs attach to your pelvis. The round ligament normally tightens and relaxes slowly.   As your baby and womb grow, the round ligament stretches. That makes it more likely to become strained.   Sudden movements can cause the ligament to tighten quickly, like a rubber band snapping. This causes a sudden and quick jabbing feeling.   Symptoms of Round Ligament Pain   Round ligament pain can be concerning and uncomfortable. But it is considered normal as your body changes during pregnancy.   The symptoms of round ligament pain include a sharp, sudden spasm in the belly. It usually affects the right side, but it may happen on both sides. The pain only lasts a few seconds.   Exercise may cause the pain, as will rapid movements such as:  sneezing  coughing  laughing  rolling over in bed  standing up too quickly   Treatment of Round Ligament Pain   Here are some tips that may help reduce your discomfort:   Pain relief. Take over-the-counter acetaminophen for pain, if necessary. Ask your doctor if this is OK.   Exercise. Get plenty of exercise to keep your stomach (core) muscles strong. Doing stretching exercises or prenatal yoga can be helpful. Ask your doctor which exercises are safe for you and your baby.   A helpful  exercise involves putting your hands and knees on the floor, lowering your head, and pushing your backside into the air.   Avoid sudden movements. Change positions slowly (such as standing up or sitting down) to avoid sudden movements that may cause stretching and pain.   Flex your hips. Bend and flex your hips before you cough, sneeze, or laugh to avoid pulling on the ligaments.   Apply warmth. A heating pad or warm bath may be helpful. Ask your doctor if this is OK. Extreme heat can be dangerous to the baby.   You should try to modify your daily activity level and avoid positions that may worsen the condition.   When to Call the Doctor/Midwife   Always tell your doctor or midwife about any type of pain you have during pregnancy. Round ligament pain is quick and doesn't last long.    Belly pain during pregnancy can be due to many different causes. It is important for your doctor to rule out more serious conditions, including pregnancy complications such as placenta abruption or non-pregnancy illnesses such as:  inguinal hernia  appendicitis  stomach, liver, and kidney problems  Preterm labor pains may sometimes be mistaken for round ligament pain.   . For your lower back pain you may:  Purchase a pregnancy belt from [employer], [employer], [employer], etc and wear it while you are up and about  Take warm baths  Use a heating pad to your lower back for no longer than 20 minutes at a time, and do not place near abdomen  Take tylenol as needed. Please follow directions on the bottle  Kinesthesiology tape (can get from sporting goods store), google how to tape belly for pregnancy

## 2017-03-13 NOTE — Progress Notes (Signed)
WORK IN FOR "FRONT AND BACK PAIN"   Pain is back and pressure  for 1 day .     [id number] 7464w4d Estimated Date of Delivery: 04/20/17  Blood pressure 110/72, pulse 88, weight 196 lb (88.9 kg), not currently breastfeeding.   BP weight and urine results all reviewed and noted.  Please refer to the obstetrical flow sheet for the fundal height and fetal heart rate documentation:  Patient reports Decreased fetal movement, denies any bleeding and no rupture of membranes symptoms or regular contractions.  No contractions, NST reactive  Cx 1/longposterior All questions were answered.  Orders Placed This Encounter  Procedures  . POCT urinalysis dipstick    Plan:  Continued routine obstetrical care, round ligament, LBP/MSK tips given  Return in about 2 weeks (around 03/27/2017) for LROB.

## 2017-03-26 ENCOUNTER — Encounter: Payer: Self-pay | Admitting: Women's Health

## 2017-03-26 ENCOUNTER — Ambulatory Visit (INDEPENDENT_AMBULATORY_CARE_PROVIDER_SITE_OTHER): Payer: Medicaid Other | Admitting: Women's Health

## 2017-03-26 VITALS — BP 104/62 | HR 88 | Wt 196.0 lb

## 2017-03-26 DIAGNOSIS — F1290 Cannabis use, unspecified, uncomplicated: Secondary | ICD-10-CM

## 2017-03-26 DIAGNOSIS — Z331 Pregnant state, incidental: Secondary | ICD-10-CM

## 2017-03-26 DIAGNOSIS — O26843 Uterine size-date discrepancy, third trimester: Secondary | ICD-10-CM

## 2017-03-26 DIAGNOSIS — Z3483 Encounter for supervision of other normal pregnancy, third trimester: Secondary | ICD-10-CM

## 2017-03-26 DIAGNOSIS — Z1389 Encounter for screening for other disorder: Secondary | ICD-10-CM

## 2017-03-26 DIAGNOSIS — O09893 Supervision of other high risk pregnancies, third trimester: Secondary | ICD-10-CM

## 2017-03-26 DIAGNOSIS — O99323 Drug use complicating pregnancy, third trimester: Secondary | ICD-10-CM

## 2017-03-26 DIAGNOSIS — Z3A36 36 weeks gestation of pregnancy: Secondary | ICD-10-CM

## 2017-03-26 LAB — POCT URINALYSIS DIPSTICK
Blood, UA: NEGATIVE
Glucose, UA: NEGATIVE
Ketones, UA: NEGATIVE
NITRITE UA: NEGATIVE
PROTEIN UA: NEGATIVE

## 2017-03-26 NOTE — Progress Notes (Signed)
Low-risk OB appointment [id number] 2952w3d Estimated Date of Delivery: 04/20/17 BP 104/62   Pulse 88   Wt 196 lb (88.9 kg)   LMP  (LMP Unknown)   BMI 33.64 kg/m   BP, weight, and urine reviewed.  Refer to obstetrical flow sheet for FH & FHR.  Reports good fm.  Denies regular uc's, lof, vb, or uti s/s. No complaints. Reviewed ptl s/s, fkc. Plan:  Continue routine obstetrical care  F/U in asap for efw/afi u/s for s<d (no visit), then 1wk for OB appointment and gbs

## 2017-03-26 NOTE — Patient Instructions (Signed)
Call the office ([phone]) or go to [facility] if:  You begin to have strong, frequent contractions  Your water breaks.  Sometimes it is a big gush of fluid, sometimes it is just a trickle that keeps getting your panties wet or running down your legs  You have vaginal bleeding.  It is normal to have a small amount of spotting if your cervix was checked.   You don't feel your baby moving like normal.  If you don't, get you something to eat and drink and lay down and focus on feeling your baby move.  You should feel at least 10 movements in 2 hours.  If you don't, you should call the office or go to [facility].     Braxton Hicks Contractions Contractions of the uterus can occur throughout pregnancy, but they are not always a sign that you are in labor. You may have practice contractions called Braxton Hicks contractions. These false labor contractions are sometimes confused with true labor. What are Braxton Hicks contractions? Braxton Hicks contractions are tightening movements that occur in the muscles of the uterus before labor. Unlike true labor contractions, these contractions do not result in opening (dilation) and thinning of the cervix. Toward the end of pregnancy (32-34 weeks), Braxton Hicks contractions can happen more often and may become stronger. These contractions are sometimes difficult to tell apart from true labor because they can be very uncomfortable. You should not feel embarrassed if you go to the hospital with false labor. Sometimes, the only way to tell if you are in true labor is for your health care provider to look for changes in the cervix. The health care provider will do a physical exam and may monitor your contractions. If you are not in true labor, the exam should show that your cervix is not dilating and your water has not broken. If there are no prenatal problems or other health problems associated with your pregnancy, it is completely safe for you to be sent  home with false labor. You may continue to have Braxton Hicks contractions until you go into true labor. How can I tell the difference between true labor and false labor?  Differences ? False labor ? Contractions last 30-70 seconds.: Contractions are usually shorter and not as strong as true labor contractions. ? Contractions become very regular.: Contractions are usually irregular. ? Discomfort is usually felt in the top of the uterus, and it spreads to the lower abdomen and low back.: Contractions are often felt in the front of the lower abdomen and in the groin. ? Contractions do not go away with walking.: Contractions may go away when you walk around or change positions while lying down. ? Contractions usually become more intense and increase in frequency.: Contractions get weaker and are shorter-lasting as time goes on. ? The cervix dilates and gets thinner.: The cervix usually does not dilate or become thin. Follow these instructions at home:  Take over-the-counter and prescription medicines only as told by your health care provider.  Keep up with your usual exercises and follow other instructions from your health care provider.  Eat and drink lightly if you think you are going into labor.  If Braxton Hicks contractions are making you uncomfortable: ? Change your position from lying down or resting to walking, or change from walking to resting. ? Sit and rest in a tub of warm water. ? Drink enough fluid to keep your urine clear or pale yellow. Dehydration may cause these contractions. ?   Do slow and deep breathing several times an hour.  Keep all follow-up prenatal visits as told by your health care provider. This is important. Contact a health care provider if:  You have a fever.  You have continuous pain in your abdomen. Get help right away if:  Your contractions become stronger, more regular, and closer together.  You have fluid leaking or gushing from your vagina.  You  pass blood-tinged mucus (bloody show).  You have bleeding from your vagina.  You have low back pain that you never had before.  You feel your baby's head pushing down and causing pelvic pressure.  Your baby is not moving inside you as much as it used to. Summary  Contractions that occur before labor are called Braxton Hicks contractions, false labor, or practice contractions.  Braxton Hicks contractions are usually shorter, weaker, farther apart, and less regular than true labor contractions. True labor contractions usually become progressively stronger and regular and they become more frequent.  Manage discomfort from Braxton Hicks contractions by changing position, resting in a warm bath, drinking plenty of water, or practicing deep breathing. This information is not intended to replace advice given to you by your health care provider. Make sure you discuss any questions you have with your health care provider. Document Released: 08/05/2005 Document Revised: 06/24/2016 Document Reviewed: 06/24/2016 Elsevier Interactive Patient Education  2017 Elsevier Inc.  

## 2017-03-27 ENCOUNTER — Ambulatory Visit (INDEPENDENT_AMBULATORY_CARE_PROVIDER_SITE_OTHER): Payer: Medicaid Other

## 2017-03-27 DIAGNOSIS — O26843 Uterine size-date discrepancy, third trimester: Secondary | ICD-10-CM | POA: Diagnosis not present

## 2017-03-27 DIAGNOSIS — Z3A36 36 weeks gestation of pregnancy: Secondary | ICD-10-CM | POA: Diagnosis not present

## 2017-03-27 DIAGNOSIS — O0930 Supervision of pregnancy with insufficient antenatal care, unspecified trimester: Secondary | ICD-10-CM

## 2017-03-27 DIAGNOSIS — Z3403 Encounter for supervision of normal first pregnancy, third trimester: Secondary | ICD-10-CM

## 2017-03-27 NOTE — Progress Notes (Signed)
US 36+4 wks,cephalic,post pl gr 3,normal ovaries bilat,fhr 152 bpm,afi 11 cm,efw 2790 g 36%

## 2017-04-03 ENCOUNTER — Ambulatory Visit (INDEPENDENT_AMBULATORY_CARE_PROVIDER_SITE_OTHER): Payer: Medicaid Other | Admitting: Obstetrics & Gynecology

## 2017-04-03 ENCOUNTER — Encounter: Payer: Self-pay | Admitting: Obstetrics & Gynecology

## 2017-04-03 VITALS — BP 90/60 | HR 72 | Wt 200.4 lb

## 2017-04-03 DIAGNOSIS — Z3483 Encounter for supervision of other normal pregnancy, third trimester: Secondary | ICD-10-CM

## 2017-04-03 DIAGNOSIS — Z3685 Encounter for antenatal screening for Streptococcus B: Secondary | ICD-10-CM

## 2017-04-03 DIAGNOSIS — Z3A37 37 weeks gestation of pregnancy: Secondary | ICD-10-CM

## 2017-04-03 DIAGNOSIS — Z331 Pregnant state, incidental: Secondary | ICD-10-CM

## 2017-04-03 DIAGNOSIS — Z1389 Encounter for screening for other disorder: Secondary | ICD-10-CM

## 2017-04-03 LAB — POCT URINALYSIS DIPSTICK
Blood, UA: NEGATIVE
Glucose, UA: NEGATIVE
KETONES UA: NEGATIVE
Leukocytes, UA: NEGATIVE
Nitrite, UA: NEGATIVE
PROTEIN UA: NEGATIVE

## 2017-04-03 NOTE — Progress Notes (Signed)
[id number] 2862w4d Estimated Date of Delivery: 04/20/17  Blood pressure 90/60, pulse 72, weight 200 lb 6.4 oz (90.9 kg), not currently breastfeeding.   BP weight and urine results all reviewed and noted.  Please refer to the obstetrical flow sheet for the fundal height and fetal heart rate documentation:  Patient reports good fetal movement, denies any bleeding and no rupture of membranes symptoms or regular contractions. Patient is without complaints. All questions were answered.  Orders Placed This Encounter  Procedures  . POCT urinalysis dipstick    Plan:  Continued routine obstetrical care, GBS done, cervix 2/th/-1/vertex soft  Return in about 1 week (around 04/10/2017).

## 2017-04-03 NOTE — Addendum Note (Signed)
Addended by: Federico FlakeNES, PEGGY A on: 04/03/2017 09:47 AM   Modules accepted: Orders

## 2017-04-05 LAB — STREP GP B NAA: Strep Gp B NAA: POSITIVE — AB

## 2017-04-06 LAB — GC/CHLAMYDIA PROBE AMP
CHLAMYDIA, DNA PROBE: NEGATIVE
NEISSERIA GONORRHOEAE BY PCR: NEGATIVE

## 2017-04-11 ENCOUNTER — Encounter: Payer: Medicaid Other | Admitting: Women's Health

## 2017-04-11 ENCOUNTER — Encounter: Payer: Self-pay | Admitting: Women's Health

## 2017-04-16 ENCOUNTER — Inpatient Hospital Stay (HOSPITAL_COMMUNITY)
Admission: AD | Admit: 2017-04-16 | Discharge: 2017-04-17 | Disposition: A | Discharge status: Home / Self Care | Payer: Medicaid Other | Source: Ambulatory Visit | Attending: Obstetrics and Gynecology | Admitting: Obstetrics and Gynecology

## 2017-04-16 ENCOUNTER — Encounter (HOSPITAL_COMMUNITY): Payer: Self-pay

## 2017-04-16 DIAGNOSIS — Z0371 Encounter for suspected problem with amniotic cavity and membrane ruled out: Secondary | ICD-10-CM | POA: Diagnosis not present

## 2017-04-16 DIAGNOSIS — Z3483 Encounter for supervision of other normal pregnancy, third trimester: Secondary | ICD-10-CM | POA: Diagnosis not present

## 2017-04-16 DIAGNOSIS — Z3A39 39 weeks gestation of pregnancy: Secondary | ICD-10-CM | POA: Insufficient documentation

## 2017-04-16 LAB — POCT FERN TEST: POCT FERN TEST: NEGATIVE

## 2017-04-16 LAB — AMNISURE RUPTURE OF MEMBRANE (ROM) NOT AT ARMC: AMNISURE: NEGATIVE

## 2017-04-16 NOTE — Discharge Instructions (Signed)

## 2017-04-16 NOTE — MAU Note (Signed)
Pt states she is unsure if water broke. State she had a gush of fluid about 1 hour ago. Fluid has not continued to leak out. Denies vaginal bleeding. Reports contractions every 9-10 mins. Reports good fetal movement. States she was 1cm last week in the office.

## 2017-04-16 NOTE — MAU Provider Note (Signed)
S: Ms. Adriana Perry is a 21 y.o. G3P2002 at 4669w3d  who presents to MAU today complaining of leaking of fluid since 1 hour prior to arrival. Leaking has not continued. She denies vaginal bleeding. She endorses irregular contractions. She reports normal fetal movement.    O: BP 128/77 (BP Location: Right Arm)   Pulse (!) 104   Temp 98.3 F (36.8 C) (Oral)   Resp 16   Ht 5\' 4"  (1.626 m)   Wt 200 lb (90.7 kg)   LMP  (LMP Unknown)   BMI 34.33 kg/m  GENERAL: Well-developed, well-nourished female in no acute distress.  HEAD: Normocephalic, atraumatic.  CHEST: Normal effort of breathing, regular heart rate ABDOMEN: Soft, nontender, gravid PELVIC: Normal external female genitalia. Vagina is pink and rugated. Cervix with normal contour, no lesions. Normal discharge.  no pooling.   Cervical exam: patient declined   Fetal Monitoring: Baseline: 140 Variability: moderate Accelerations: 15x15 Decelerations: none Contractions: irr ctx  Results for orders placed or performed during the hospital encounter of 04/16/17 (from the past 24 hour(s))  Fern Test     Status: None   Collection Time: 04/16/17 11:11 PM  Result Value Ref Range   POCT Fern Test Negative = intact amniotic membranes   [facility]     Status: None   Collection Time: 04/16/17 11:20 PM  Result Value Ref Range   Amnisure ROM NEGATIVE      A: SIUP at 3769w3d  Membranes intact  P: Discharge home Keep f/u with OB tomorrow  Judeth HornLawrence, Kyerra Vargo, NP 04/16/2017 11:26 PM

## 2017-04-17 ENCOUNTER — Ambulatory Visit (INDEPENDENT_AMBULATORY_CARE_PROVIDER_SITE_OTHER): Payer: Medicaid Other | Admitting: Obstetrics & Gynecology

## 2017-04-17 ENCOUNTER — Encounter: Payer: Self-pay | Admitting: Obstetrics & Gynecology

## 2017-04-17 VITALS — BP 120/70 | HR 120 | Wt 203.0 lb

## 2017-04-17 DIAGNOSIS — Z1389 Encounter for screening for other disorder: Secondary | ICD-10-CM

## 2017-04-17 DIAGNOSIS — Z3A39 39 weeks gestation of pregnancy: Secondary | ICD-10-CM | POA: Diagnosis not present

## 2017-04-17 DIAGNOSIS — Z3483 Encounter for supervision of other normal pregnancy, third trimester: Secondary | ICD-10-CM

## 2017-04-17 DIAGNOSIS — Z331 Pregnant state, incidental: Secondary | ICD-10-CM

## 2017-04-17 LAB — POCT URINALYSIS DIPSTICK
Blood, UA: NEGATIVE
Glucose, UA: NEGATIVE
KETONES UA: NEGATIVE
Nitrite, UA: NEGATIVE
PROTEIN UA: NEGATIVE

## 2017-04-17 NOTE — Progress Notes (Signed)
[id number] 6741w4d Estimated Date of Delivery: 04/20/17  Blood pressure 120/70, pulse (!) 120, weight 203 lb (92.1 kg), not currently breastfeeding.   BP weight and urine results all reviewed and noted.  Please refer to the obstetrical flow sheet for the fundal height and fetal heart rate documentation:  Patient reports good fetal movement, denies any bleeding and no rupture of membranes symptoms or regular contractions. Patient is without complaints. All questions were answered.  No orders of the defined types were placed in this encounter.   Plan:  Continued routine obstetrical care, cx 4/50/-2/posterior/soft  Return in about 1 week (around 04/24/2017) for LROB.

## 2017-04-21 ENCOUNTER — Telehealth (HOSPITAL_COMMUNITY): Payer: Self-pay | Admitting: *Deleted

## 2017-04-24 ENCOUNTER — Encounter: Payer: Self-pay | Admitting: Advanced Practice Midwife

## 2017-05-06 ENCOUNTER — Ambulatory Visit: Payer: Medicaid Other | Admitting: Obstetrics & Gynecology

## 2017-05-07 ENCOUNTER — Encounter: Payer: Self-pay | Admitting: Advanced Practice Midwife

## 2017-05-07 ENCOUNTER — Ambulatory Visit (INDEPENDENT_AMBULATORY_CARE_PROVIDER_SITE_OTHER): Payer: Medicaid Other | Admitting: Advanced Practice Midwife

## 2017-05-07 VITALS — BP 102/80 | HR 60 | Ht 64.0 in | Wt 194.5 lb

## 2017-05-07 DIAGNOSIS — M545 Low back pain, unspecified: Secondary | ICD-10-CM

## 2017-05-07 NOTE — Progress Notes (Signed)
[facility] Visit  Patient name: Adriana Perry MRN [id number]  Date of birth: 05/03/1996  CC & HPI:  Adriana Perry is a 21 y.o.  female presenting today for back pain since delivery. Had a SVD at [facility] on 04/21/17   Did not have an epidural. Pain is in lower back and also has symphysis pubis pain. Said mom thought we could give her some pain medicien. States that has already tried flexeril, ibuprofen and naprosyn.  Discussed that narcotics are not appropriate, esp since breastfeeding and delivered 3 weeks ago.  Plans nexplanon (already here).  Pertinent History Reviewed:  Medical & Surgical Hx:   Past Medical History:  Diagnosis Date  . Bleeding 08/29/2015  . Medical history non-contributory   . Nausea 09/21/2015  . Supervision of normal pregnancy in first trimester 09/21/2015    [facility] at   9+2 weeks FOB  Tai Fitzgerald 22 yo BM 2nd  Dating By   US Pap  GC/CT Initial:                36+wks: Genetic Screen NT/IT:  CF screen  Anatomic US  Flu vaccine  Tdap Recommended ~ 28wks Glucose Screen  2 hr GBS  Feed Preference  Contraception  Circumcision  Childbirth Classes  Pediatrician     Past Surgical History:  Procedure Laterality Date  . NO PAST SURGERIES     Family History  Problem Relation Age of Onset  . Hypertension Paternal Grandfather   . Diabetes Paternal Grandfather   . Thyroid disease Paternal Grandfather   . Hypertension Paternal Grandmother   . Diabetes Paternal Grandmother   . Diabetes Maternal Grandfather   . Hypertension Maternal Grandfather    No current outpatient prescriptions on file. Social History: Reviewed -  reports that she has quit smoking. Her smoking use included Cigarettes. She smoked 0.00 packs per day for 1.00 year. She has never used smokeless tobacco.  Review of Systems:   Constitutional: Negative for fever and chills Eyes: Negative for visual disturbances Respiratory: Negative for shortness of breath,  dyspnea Cardiovascular: Negative for chest pain or palpitations  Gastrointestinal: Negative for vomiting, diarrhea and constipation; no abdominal pain Genitourinary: Negative for dysuria and urgency, vaginal irritation or itching Musculoskeletal: Negative for back pain, joint pain, myalgias  Neurological: Negative for dizziness and headaches    Objective Findings:    Physical Examination: General appearance - well appearing, and in no distress Mental status - alert, oriented to person, place, and time Chest:  Normal respiratory effort Heart - normal rate and regular rhythm Back:  Pain is across lower back, mainly in the center.   Pelvic: deferred Musculoskeletal:  LBP w/o sciatica Extremities:  No edema    No results found for this or any previous visit (from the past 24 hour(s)).    Assessment & Plan:  A:   LPB, MSK Symphysis pubis pain P:  Lidocaine patch sample applied Tips given   Return for As scheduled.  CRESENZO-DISHMAN,FRANCES CNM 05/07/2017 11:14 AM      

## 2017-05-07 NOTE — Patient Instructions (Signed)
Symphysis pubis diastasis exercises  For your lower back pain you may:  Ice packs/Lidocaine 4% patches  Take warm baths  Use a heating pad to your lower back for no longer than 20 minutes at a time  Take tylenol /ibuprofen as needed. Please follow directions on the bottle  Kinesthesiology tape (can get from sporting goods store)

## 2017-05-21 ENCOUNTER — Encounter: Payer: Self-pay | Admitting: Women's Health

## 2017-05-21 ENCOUNTER — Ambulatory Visit (INDEPENDENT_AMBULATORY_CARE_PROVIDER_SITE_OTHER): Payer: Medicaid Other | Admitting: Women's Health

## 2017-05-21 NOTE — Patient Instructions (Signed)
NO SEX UNTIL AFTER YOU GET YOUR BIRTH CONTROL  

## 2017-05-21 NOTE — Progress Notes (Signed)
   [facility] Postpartum Visit  Patient name: Adriana Perry MRN [id number]  Date of birth: 1996/07/17 CC & HPI:  Adriana Perry is a 21 y.o. [id number] Caucasian female being seen today for a postpartum visit. She is 4 weeks postpartum following a spontaneous vaginal delivery at 40.1 gestational weeks at [facility]- didn't think she could make it to [facility], delivered about 1.5hrs after arrival. Anesthesia: none. I have fully reviewed the prenatal and intrapartum course. Postpartum course has been uncomplicated. Baby's course has been uncomplicated. Baby is feeding by breast & bottle. Bleeding thin lochia. Bowel function is normal. Bladder function is normal. Patient is not sexually active. Last sexual activity: prior to birth of baby. Contraception method is wants nexplanon- appt for insertion tomorrow. Postpartum depression screening: negative. Score 1.  Last pap 10/16/15 and was neg.  Review of Systems:   Denies Abnormal vaginal discharge w/ itching/odor/irritation, headaches, visual changes, shortness of breath, chest pain, abdominal pain, severe nausea/vomiting, or problems with urination or bowel movements.  Pertinent History Reviewed:  Reviewed past medical,surgical and family history.  Reviewed problem list, medications and allergies.  OB History  Gravida Para Term Preterm AB Living  SAB TAB Ectopic Multiple Live Births        0 3    # Outcome Date GA Lbr Len/2nd Weight Sex Delivery Anes PTL Lv  3 Term 04/21/17 [redacted]w[redacted]d  6 lb 6 oz (2.892 kg) F Vag-Spont None  LIV  2 Term 04/21/16 [redacted]w[redacted]d  6 lb 6 oz (2.892 kg) M Vag-Spont EPI N LIV  1 Term 06/22/14 [redacted]w[redacted]d 08:18 / 01:17 6 lb 2.6 oz (2.795 kg) M Vag-Spont EPI N LIV     Birth Comments: none     Objective Findings:   Vitals:   05/21/17 1039  BP: (!) 98/58  Pulse: 66  Weight: 197 lb (89.4 kg)  Height:  (1.626 m)    Body mass index is 33.81 kg/m. No LMP recorded (lmp unknown).  General:  alert, cooperative and no distress    Breasts:  deferred, no complaints  Lungs: clear to auscultation bilaterally  Heart:  regular rate and rhythm  Abdomen: soft, nontender   Vulva: normal  Vagina: normal vagina  Cervix:  closed  Corpus: Well-involuted  Adnexa:  Non-palpable  Rectal Exam: No hemorrhoids        No results found for this or any previous visit (from the past 24 hour(s)).  Assessment & Plan:  1) Postpartum exam 2) 4 wks s/p SVB at [facility] 3) Breast & bottlefeeding 4) Depression screening 5) Contraception counseling, pt prefers abstinence until after Nexplanon  Return for As scheduled. tomorrow for Nexplanon insertion  Marge Duncans CNM, [facility] 05/21/2017 11:12 AM

## 2017-05-22 ENCOUNTER — Ambulatory Visit (INDEPENDENT_AMBULATORY_CARE_PROVIDER_SITE_OTHER): Payer: Medicaid Other | Admitting: Advanced Practice Midwife

## 2017-05-22 ENCOUNTER — Encounter: Payer: Self-pay | Admitting: Advanced Practice Midwife

## 2017-05-22 VITALS — BP 124/78 | HR 86 | Ht 64.0 in | Wt 196.0 lb

## 2017-05-22 DIAGNOSIS — Z30017 Encounter for initial prescription of implantable subdermal contraceptive: Secondary | ICD-10-CM | POA: Insufficient documentation

## 2017-05-22 DIAGNOSIS — Z3049 Encounter for surveillance of other contraceptives: Secondary | ICD-10-CM | POA: Diagnosis not present

## 2017-05-22 DIAGNOSIS — Z3202 Encounter for pregnancy test, result negative: Secondary | ICD-10-CM

## 2017-05-22 LAB — POCT URINE PREGNANCY: PREG TEST UR: NEGATIVE

## 2017-05-22 MED ORDER — ETONOGESTREL 68 MG ~~LOC~~ IMPL
68.0000 mg | DRUG_IMPLANT | Freq: Once | SUBCUTANEOUS | Status: AC
  Administered 2017-05-22: 68 mg via SUBCUTANEOUS

## 2017-05-22 NOTE — Progress Notes (Signed)
  HPI:  Adriana Perry is a 21 y.o. year old  female here for Nexplanon insertion.  She has not had sex since before delivery of her baby , and her pregnancy test today was negative.  Risks/benefits/side effects of Nexplanon have been discussed and her questions have been answered.  Specifically, a failure rate of 08/998 has been reported, with an increased failure rate if pt takes [place] and/or antiseizure medicaitons.  Adriana Perry is aware of the common side effect of irregular bleeding, which the incidence of decreases over time.   Past Medical History: Past Medical History:  Diagnosis Date  . Bleeding 08/29/2015  . Medical history non-contributory   . Nausea 09/21/2015  . Supervision of normal pregnancy in first trimester 09/21/2015    [place] Initiated Care at   9+2 weeks FOB  Tai Fitzgerald 22 yo BM 2nd  Dating By   [place] Pap  GC/CT Initial:                36+wks: Genetic Screen NT/IT:  CF screen  Anatomic [place]  Flu vaccine  Tdap Recommended ~ 28wks Glucose Screen  2 hr GBS  Feed Preference  Contraception  Circumcision  Childbirth Classes  Pediatrician      Past Surgical History: Past Surgical History:  Procedure Laterality Date  . NO PAST SURGERIES      Family History: Family History  Problem Relation Age of Onset  . Hypertension Paternal Grandfather   . Diabetes Paternal Grandfather   . Thyroid disease Paternal Grandfather   . Hypertension Paternal Grandmother   . Diabetes Paternal Grandmother   . Diabetes Maternal Grandfather   . Hypertension Maternal Grandfather     Social History: Social History  Substance Use Topics  . Smoking status: Former Smoker    Packs/day: 0.00    Years: 1.00    Types: Cigarettes  . Smokeless tobacco: Never Used     Comment: quit february 2018  . Alcohol use No    Allergies:  Allergies  Allergen Reactions  . Latex Swelling      Her left arm, approximatly 4 inches proximal from the elbow, was cleansed with alcohol and  anesthetized with 2cc of 2% Lidocaine.  The area was cleansed again and the Nexplanon was inserted without difficulty.  A pressure bandage was applied.  Pt was instructed to remove pressure bandage in a few hours, and keep insertion site covered with a bandaid for 3 days.  Back up contraception was recommended for 2 weeks.  Follow-up scheduled PRN problems  CRESENZO-DISHMAN,Eleftheria Taborn 05/22/2017 9:55 AM
